# Patient Record
Sex: Male | Born: 1935 | Race: White | Hispanic: No | Smoking: Never smoker
Health system: Southern US, Community
[De-identification: ages and names within clinical notes are randomized; demographics above are authoritative.]

## PROBLEM LIST (undated history)

## (undated) DIAGNOSIS — M1990 Unspecified osteoarthritis, unspecified site: Secondary | ICD-10-CM

## (undated) DIAGNOSIS — E785 Hyperlipidemia, unspecified: Secondary | ICD-10-CM

## (undated) DIAGNOSIS — Z950 Presence of cardiac pacemaker: Secondary | ICD-10-CM

## (undated) DIAGNOSIS — R55 Syncope and collapse: Secondary | ICD-10-CM

## (undated) DIAGNOSIS — I495 Sick sinus syndrome: Secondary | ICD-10-CM

## (undated) DIAGNOSIS — R351 Nocturia: Secondary | ICD-10-CM

## (undated) DIAGNOSIS — D510 Vitamin B12 deficiency anemia due to intrinsic factor deficiency: Secondary | ICD-10-CM

## (undated) DIAGNOSIS — R2681 Unsteadiness on feet: Secondary | ICD-10-CM

## (undated) DIAGNOSIS — Z974 Presence of external hearing-aid: Secondary | ICD-10-CM

## (undated) DIAGNOSIS — M810 Age-related osteoporosis without current pathological fracture: Secondary | ICD-10-CM

## (undated) DIAGNOSIS — R251 Tremor, unspecified: Secondary | ICD-10-CM

## (undated) DIAGNOSIS — N400 Enlarged prostate without lower urinary tract symptoms: Secondary | ICD-10-CM

## (undated) DIAGNOSIS — H9190 Unspecified hearing loss, unspecified ear: Secondary | ICD-10-CM

## (undated) DIAGNOSIS — I10 Essential (primary) hypertension: Secondary | ICD-10-CM

## (undated) HISTORY — DX: Essential (primary) hypertension: I10

## (undated) HISTORY — DX: Vitamin B12 deficiency anemia due to intrinsic factor deficiency: D51.0

## (undated) HISTORY — DX: Benign prostatic hyperplasia without lower urinary tract symptoms: N40.0

## (undated) HISTORY — PX: TONSILLECTOMY AND ADENOIDECTOMY: SUR1326

## (undated) HISTORY — DX: Syncope and collapse: R55

## (undated) HISTORY — DX: Age-related osteoporosis without current pathological fracture: M81.0

## (undated) HISTORY — DX: Tremor, unspecified: R25.1

## (undated) HISTORY — DX: Hyperlipidemia, unspecified: E78.5

## (undated) HISTORY — DX: Nocturia: R35.1

## (undated) HISTORY — PX: APPENDECTOMY: SHX54

## (undated) HISTORY — DX: Sick sinus syndrome: I49.5

## (undated) HISTORY — DX: Unsteadiness on feet: R26.81

## (undated) HISTORY — PX: CHOLECYSTECTOMY: SHX55

## (undated) HISTORY — DX: Presence of external hearing-aid: Z97.4

## (undated) HISTORY — DX: Unspecified osteoarthritis, unspecified site: M19.90

---

## 1998-01-22 ENCOUNTER — Emergency Department (HOSPITAL_COMMUNITY): Admission: EM | Admit: 1998-01-22 | Discharge: 1998-01-22 | Payer: Self-pay | Admitting: Emergency Medicine

## 1998-03-18 ENCOUNTER — Emergency Department (HOSPITAL_COMMUNITY): Admission: EM | Admit: 1998-03-18 | Discharge: 1998-03-18 | Payer: Self-pay | Admitting: Emergency Medicine

## 1998-03-30 ENCOUNTER — Ambulatory Visit (HOSPITAL_COMMUNITY): Admission: RE | Admit: 1998-03-30 | Discharge: 1998-03-30 | Payer: Self-pay | Admitting: *Deleted

## 2002-07-24 ENCOUNTER — Encounter: Admission: RE | Admit: 2002-07-24 | Discharge: 2002-07-24 | Payer: Self-pay | Admitting: Internal Medicine

## 2002-07-24 ENCOUNTER — Encounter: Payer: Self-pay | Admitting: Internal Medicine

## 2002-08-16 ENCOUNTER — Encounter: Admission: RE | Admit: 2002-08-16 | Discharge: 2002-08-16 | Payer: Self-pay | Admitting: Internal Medicine

## 2002-08-16 ENCOUNTER — Encounter: Payer: Self-pay | Admitting: Internal Medicine

## 2003-12-15 ENCOUNTER — Ambulatory Visit (HOSPITAL_COMMUNITY): Admission: RE | Admit: 2003-12-15 | Discharge: 2003-12-15 | Payer: Self-pay | Admitting: Cardiology

## 2004-12-09 ENCOUNTER — Emergency Department (HOSPITAL_COMMUNITY): Admission: EM | Admit: 2004-12-09 | Discharge: 2004-12-09 | Payer: Self-pay | Admitting: Emergency Medicine

## 2005-04-15 ENCOUNTER — Ambulatory Visit (HOSPITAL_COMMUNITY): Admission: RE | Admit: 2005-04-15 | Discharge: 2005-04-15 | Payer: Self-pay | Admitting: *Deleted

## 2005-04-15 ENCOUNTER — Encounter (INDEPENDENT_AMBULATORY_CARE_PROVIDER_SITE_OTHER): Payer: Self-pay | Admitting: Specialist

## 2009-08-09 ENCOUNTER — Emergency Department (HOSPITAL_COMMUNITY): Admission: EM | Admit: 2009-08-09 | Discharge: 2009-08-09 | Payer: Self-pay | Admitting: Emergency Medicine

## 2010-12-07 LAB — BASIC METABOLIC PANEL
BUN: 20 mg/dL (ref 6–23)
Calcium: 9.5 mg/dL (ref 8.4–10.5)
Creatinine, Ser: 1.25 mg/dL (ref 0.4–1.5)
GFR calc non Af Amer: 55 mL/min — ABNORMAL LOW (ref >60)

## 2010-12-07 LAB — CBC
HCT: 39.6 % (ref 39.0–52.0)
MCV: 101.8 fL — ABNORMAL HIGH (ref 78.0–100.0)
RDW: 14.3 % (ref 11.5–15.5)
WBC: 7.1 10*3/uL (ref 4.0–10.5)

## 2010-12-07 LAB — DIFFERENTIAL
Basophils Absolute: 0 10*3/uL (ref 0.0–0.1)
Eosinophils Absolute: 0.2 10*3/uL (ref 0.0–0.7)
Eosinophils Relative: 3 % (ref 0–5)
Lymphocytes Relative: 24 % (ref 12–46)
Lymphs Abs: 1.7 10*3/uL (ref 0.7–4.0)

## 2010-12-07 LAB — MAGNESIUM: Magnesium: 2.4 mg/dL (ref 1.5–2.5)

## 2011-01-21 NOTE — Op Note (Signed)
NAMEJAEQUAN, PROPES NO.:  [id number]   MEDICAL RECORD NO.:  [id number]          PATIENT TYPE:  AMB   LOCATION:  ENDO                         FACILITY:  [facility]   PHYSICIAN:  Georgiana Spinner, M.D.    DATE OF BIRTH:  08/13/1925   DATE OF PROCEDURE:  04/15/2005  DATE OF DISCHARGE:                                 OPERATIVE REPORT   PROCEDURE:  Colonoscopy with biopsy.   INDICATIONS:  Diarrhea.   ANESTHESIA:  Demerol 20, Versed 4 mg.   PROCEDURE:  With the patient mildly sedated in the left lateral decubitus  position, the Olympus videoscopic colonoscope was inserted in the rectum and  passed under direct vision to the cecum, identified by ileocecal valve and  appendiceal orifice, both of which were photographed.  From this point, the  colonoscope was slowly withdrawn, taking circumferential views of colonic  mucosa, stopping only on the way to photograph diverticula seen in the  sigmoid colon and to take random colonic biopsies from normal mucosa to rule  out microscopic colitis until we reached the rectum which appeared normal on  direct and showed hemorrhoids on retroflexed view.  The endoscope was  straightened, withdrawn.  The patient's vital signs and pulse oximeter  remained stable.  The patient tolerated the procedure well without apparent  complications.   FINDINGS:  Diverticulosis of sigmoid colon.  Internal hemorrhoids.  Otherwise, an unremarkable colonoscopic examination to the cecum.       GMO/MEDQ  D:  04/15/2005  T:  04/15/2005  Job:  [id number]

## 2012-05-09 ENCOUNTER — Ambulatory Visit
Admission: RE | Admit: 2012-05-09 | Discharge: 2012-05-09 | Disposition: A | Discharge status: Home / Self Care | Payer: Medicare Other | Source: Ambulatory Visit | Attending: Internal Medicine | Admitting: Internal Medicine

## 2012-05-09 ENCOUNTER — Other Ambulatory Visit: Payer: Self-pay | Admitting: Internal Medicine

## 2012-05-09 DIAGNOSIS — R410 Disorientation, unspecified: Secondary | ICD-10-CM

## 2013-11-14 ENCOUNTER — Encounter: Payer: Self-pay | Admitting: Cardiology

## 2013-11-25 ENCOUNTER — Telehealth: Payer: Self-pay | Admitting: Internal Medicine

## 2013-11-25 ENCOUNTER — Encounter: Payer: Self-pay | Admitting: Internal Medicine

## 2013-11-25 ENCOUNTER — Ambulatory Visit (INDEPENDENT_AMBULATORY_CARE_PROVIDER_SITE_OTHER): Payer: Medicare Other | Admitting: Internal Medicine

## 2013-11-25 ENCOUNTER — Encounter: Payer: Self-pay | Admitting: *Deleted

## 2013-11-25 VITALS — BP 128/82 | HR 102 | Ht 70.0 in | Wt 153.2 lb

## 2013-11-25 DIAGNOSIS — I495 Sick sinus syndrome: Secondary | ICD-10-CM

## 2013-11-25 DIAGNOSIS — R55 Syncope and collapse: Secondary | ICD-10-CM

## 2013-11-25 DIAGNOSIS — I10 Essential (primary) hypertension: Secondary | ICD-10-CM

## 2013-11-25 LAB — CBC WITH DIFFERENTIAL/PLATELET
BASOS ABS: 0 10*3/uL (ref 0.0–0.1)
Basophils Relative: 0.5 % (ref 0.0–3.0)
Eosinophils Absolute: 0.2 10*3/uL (ref 0.0–0.7)
Eosinophils Relative: 2.8 % (ref 0.0–5.0)
HCT: 37.5 % — ABNORMAL LOW (ref 39.0–52.0)
Hemoglobin: 12.5 g/dL — ABNORMAL LOW (ref 13.0–17.0)
LYMPHS ABS: 1.4 10*3/uL (ref 0.7–4.0)
LYMPHS PCT: 18.6 % (ref 12.0–46.0)
MCHC: 33.3 g/dL (ref 30.0–36.0)
MCV: 104.1 fl — ABNORMAL HIGH (ref 78.0–100.0)
MONO ABS: 0.6 10*3/uL (ref 0.1–1.0)
Monocytes Relative: 8.5 % (ref 3.0–12.0)
NEUTROS ABS: 5.3 10*3/uL (ref 1.4–7.7)
Neutrophils Relative %: 69.6 % (ref 43.0–77.0)
PLATELETS: 296 10*3/uL (ref 150.0–400.0)
RBC: 3.61 Mil/uL — ABNORMAL LOW (ref 4.22–5.81)
RDW: 15.2 % — AB (ref 11.5–14.6)
WBC: 7.6 10*3/uL (ref 4.5–10.5)

## 2013-11-25 LAB — BASIC METABOLIC PANEL
BUN: 20 mg/dL (ref 6–23)
CO2: 25 meq/L (ref 19–32)
Calcium: 9.5 mg/dL (ref 8.4–10.5)
Chloride: 101 mEq/L (ref 96–112)
Creatinine, Ser: 1.2 mg/dL (ref 0.4–1.5)
GFR: 62.07 mL/min (ref >60.00)
Glucose, Bld: 111 mg/dL — ABNORMAL HIGH (ref 70–99)
POTASSIUM: 3.9 meq/L (ref 3.5–5.1)
SODIUM: 134 meq/L — AB (ref 135–145)

## 2013-11-25 NOTE — Telephone Encounter (Signed)
New message     Pt saw Dr Johney FrameAllred today.  He says his heart is beating fast.  Should he take an aspirin?

## 2013-11-25 NOTE — Progress Notes (Signed)
Patient ID: Julian Taylor, male   DOB: [birth year], 80 y.o.   MRN: [id number]    Julian Muffunn, Kinta    Date of visit:  11/14/2013 DOB:  [birth year]    Age:  [age] yrs. Medical record number:  [id number]     Account number:  [id number] Primary Care Provider: Georgianne FickAMACHANDRAN, AJITH ____________________________ CURRENT DIAGNOSES  1. Syncope  2. Hypertension,Essential (Benign)  3. Hyperlipidemia  4. Pernicious Anemia ____________________________ ALLERGIES  No Known Allergies ____________________________ MEDICATIONS  1. folic acid 400 mcg tablet, 1 p.o. daily  2. cyanocobalamin (vit B-12) 1,000 mcg/mL injection solution, q month  3. aspirin 81 mg chewable tablet, 1 p.o. daily  4. atorvastatin 20 mg tablet, 1 p.o. daily  5. tramadol 50 mg tablet, PRN  6. tamsulosin ER 0.4 mg capsule,extended release 24 hr, 1 p.o. daily  7. amlodipine 5 mg tablet, 1 p.o. daily ____________________________ CHIEF COMPLAINTS  Syncope  better when he stopped losartan ____________________________ HISTORY OF PRESENT ILLNESS This very nice 80 year old male is seen for evaluation of syncope. The patient has a previous history of hypertension and also has significant BPH with frequent nocturia. He had a history of syncope several years ago according to the son at which point in time he was placed on beta blockers and after they were withdrawn had no recurrent problems. He again had some recurrent issues the year or so ago and again beta blockers were stopped. He recently had losartan added to his regimen as well as tamsulosin and had another episode of syncope after he had taking a shower and got out of the bathtub. He fell twisting his foot and had some bruise on it and was felt to have a slight fracture of the foot. This happened back in February but he has not had any recurrent episodes since then. He denies angina and has no PND, orthopnea or edema. He has long-standing tremor that has been present for years. He has an unsteady gait and  walks with a cane. He does not have angina and is able to live alone and manage most of his affairs. He is still driving. He has complained of muscles aching and generalized pain and his son thinks it has improved somewhat since his statins were stopped. He also is no longer taking losartan and has had no more syncope. ____________________________ PAST HISTORY  Past Medical Illnesses:  hypertension, hyperlipidemia, anemia, syncope, essential tremor, BPH;  Cardiovascular Illnesses:  no previous history of cardiac disease;  Surgical Procedures:  appendectomy, tonsillectomy/adenoids, cholecystectomy;  Cardiology Procedures-Invasive:  no previous interventional or invasive cardiology procedures;  Cardiology Procedures-Noninvasive:  no previous non-invasive cardiovascular testing;  LVEF not documented,   ____________________________ CARDIO-PULMONARY TEST DATES EKG Date:  11/14/2013;   ____________________________ FAMILY HISTORY Father -- Father dead, CVA Mother -- Mother dead Sister -- Sister alive and well Sister -- Sister alive and well ____________________________ SOCIAL HISTORY Alcohol Use:  no alcohol use;  Smoking:  used to smoke but quit;  Diet:  regular diet;  Lifestyle:  widower;  Exercise:  no regular exercise;  Occupation:  security at [employer];  Residence:  lives alone;   ____________________________ REVIEW OF SYSTEMS General:  denies recent weight change, fatique or change in exercise tolerance.  Integumentary:no rashes or new skin lesions. Eyes: wears eye glasses/contact lenses, cataract extraction Ears, Nose, Throat, Mouth:  partial hearing loss Respiratory: denies dyspnea, cough, wheezing or hemoptysis. Cardiovascular:  please review HPI Abdominal: constipation Genitourinary-Male: hesitancy, nocturia, frequency  Musculoskeletal:  generalized pain Neurological:  see HPI  Psychiatric:  denies depession or anxiety  ____________________________ PHYSICAL EXAMINATION VITAL SIGNS  Blood  Pressure:  124/70 Sitting, Left arm, regular cuff  , 120/72 Standing, Left arm and regular cuff   Pulse:  88/min. Weight:  154.00 lbs. Height:  71"BMI: 21  Constitutional:  pleasant white male in no acute distress walks with cane Skin:  warm and dry to touch, no apparent skin lesions, or masses noted. Head:  normocephalic, normal hair pattern, no masses or tenderness Eyes:  EOMS Intact, PERRLA, C and S clear, Funduscopic exam not done. ENT:  bilateral hearing aides present Neck:  supple, without massess. No JVD, thyromegaly or carotid bruits. Carotid upstroke normal. Chest:  normal symmetry, clear to auscultation. Cardiac:  regular rhythm, normal S1 and S2, No S3 or S4, no murmurs, gallops or rubs detected. Abdomen:  abdomen soft,non-tender, no masses, no hepatospenomegaly, or aneurysm noted Peripheral Pulses:  the femoral,dorsalis pedis, and posterior tibial pulses are full and equal bilaterally with no bruits auscultated. Extremities & Back:  no deformities, clubbing, cyanosis, erythema or edema observed. Normal muscle strength and tone. Neurological:  resting tremor, unsteady gait, wide based gait ____________________________ IMPRESSIONS/PLAN  1. Episode of syncope that may have been orthostatic in nature and may have been related to the addition of blood pressure medications 2. Significant BPH with nocturia and frequency 3. Hypertension 4. Tremor that is likely essential tremor 5. Hyperlipidemia  Recommendations:  He has not had recurrent syncope and his initial blood pressure was on the lower side when he came here when I rechecked it it was 150/70 after he ambulated down the hall. At this point he does have recurrent syncope that may have been medication or blood pressure related. At this point I think he needs the following workup and recommendations:  A. Obtain echocardiogram to evaluate her cardiac function and structural heart disease B. Wear a cardiac event monitor to be sure  he is not having bradyarrhythmias as a cause for his syncope C. Statins appear to be causing some muscle aches and I think at his age of 80 it would not be unreasonable just to stop them D. I would recommend obtaining a blood pressure monitor and monitoring blood pressure at home particularly standing blood pressure. A target blood pressure for his age would be around 150 systolic to 160 systolic. I think it would be important to avoid medicines that would lower his blood pressure particularly standing blood pressure. I would stay off of losartan.  His EKG shows left axis deviation and normal sinus rhythm. ____________________________ TODAYS ORDERS  1. 2D, color flow, doppler: First Available  2. [place]: 1 Day  3. Return Visit: 1 month  4. 12 Lead EKG: Today                       ____________________________ Cardiology Physician:  Darden Palmer MD [facility]

## 2013-11-25 NOTE — Telephone Encounter (Signed)
Discussed with Dr Johney FrameAllred and he said to let him know its okay.  Try and relax and it should come back down.  If not he can call us back but he is scheduled for PPM Implant on Thurs with echo prior on Wed

## 2013-11-25 NOTE — Progress Notes (Signed)
Primary Care Physician:   Dr Nicholos Johns Referring Physician:  Dr Rockne Coons Julian Taylor is a 80 y.o. male with a h/o hypertension, advanced age and recurrent syncope who presents for EP consultation.  He has had multiple episodes of syncope over the past few years.  Initially his syncope was attributed to beta blockers.  His syncope initially resolved with cessation of beta blockers.  Off of beta blockers he has done well.  3/14, he had an episode of syncope while off of beta blocker therapy.  He reports that he became very weak while shoveling snow.  He went inside and sat down on the commode to "rest".  He passed out and fell into the floor.  He feels clear that he was not straining or having a vagal event at the time.  He did very well until 2/15 when he had another episode of syncope.  With this event, he left the shower and was walking into his bedroom.  He collapsed with brief LOC and fell into the floor.  He was evaluated by Dr Donnie Aho and had an event monitor placed.  This has documented multiple sinus pauses of greated than 3 seconds.  He reports symptoms of palpitations and presyncope which correspond to his pauses.  He reports that this is a similar sensation to the one he has had prior to previous syncope.  Today, he denies symptoms of  chest pain, shortness of breath, orthopnea, PND, lower extremity edema, or neurologic sequela.  He is primarily limited by DJD in his knees.   The patient is tolerating medications without difficulties and is otherwise without complaint today.   Past Medical History  Diagnosis Date  . Essential hypertension, benign   . Pernicious anemia   . Hyperlipidemia   . BPH (benign prostatic hyperplasia)     With frequent nocturia  . Nocturia     Frequent  . Syncope     First happened several years ago and was better when Losartan was stopped. Started taking Losartan and Flomax and had another syncopal episode in Feb 2015.  . Tremor     Long-standing  .  Unsteady gait     Uses cane  . Wears hearing aid     Both ears  . Sick sinus syndrome   . Osteoarthritis   . Osteoporosis    Past Surgical History  Procedure Laterality Date  . Appendectomy    . Tonsillectomy and adenoidectomy    . Cholecystectomy      Current Outpatient Prescriptions  Medication Sig Dispense Refill  . acetaminophen (TYLENOL) 500 MG tablet Take 500 mg by mouth every 6 (six) hours as needed.      Marland Kitchen amLODipine (NORVASC) 5 MG tablet Take 5 mg by mouth daily.      Marland Kitchen aspirin 81 MG chewable tablet Chew 81 mg by mouth daily.      . Calcium Carb-Cholecalciferol (CALCIUM-VITAMIN D) 600-400 MG-UNIT TABS Take 1 tablet by mouth daily.      . CYANOCOBALAMIN IJ (Vitamin B12 1000 mcg/mL)  Inject once a month      . folic acid (FOLVITE) 400 MCG tablet Take 400 mcg by mouth daily.      Marland Kitchen ibuprofen (ADVIL,MOTRIN) 200 MG tablet Take 200 mg by mouth every 6 (six) hours as needed.      . traMADol (ULTRAM) 50 MG tablet Take 50 mg by mouth as needed.       No current facility-administered medications for this visit.    No  Known Allergies  History   Social History  . Marital Status: Widowed    Spouse Name: N/A    Number of Children: N/A  . Years of Education: N/A   Occupational History  . Not on file.   Social History Main Topics  . Smoking status: Former Games developermoker  . Smokeless tobacco: Not on file  . Alcohol Use: No  . Drug Use: No  . Sexual Activity: Not on file   Other Topics Concern  . Not on file   Social History Narrative   [place] in [place].  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 CVA Father     ROS- All systems are reviewed and negative except as per the HPI above  Physical Exam: Filed Vitals:   11/25/13 1028  BP: 128/82  Pulse: 102  Height: 5\' 10"  (1.778 m)  Weight: 153 lb 3.2 oz (69.491 kg)    GEN- The patient is well appearing, alert and oriented x 3 today.   Head- normocephalic,  atraumatic Eyes-  Sclera clear, conjunctiva pink Ears- hearing intact Oropharynx- clear Neck- supple, no JVP Lymph- no cervical lymphadenopathy Lungs- Clear to ausculation bilaterally, normal work of breathing Heart- Regular rate and rhythm, no murmurs, rubs or gallops, PMI not laterally displaced GI- soft, NT, ND, + BS Extremities- no clubbing, cyanosis, or edema MS- no significant deformity or atrophy Skin- no rash or lesion Psych- euthymic mood, full affect Neuro- strength and sensation are intact  EKG 11/14/13 reveals sinus rhythm 79 bpm, PR 153, LAHB Event monitor is reviewed and discussed with Dr Donnie Ahoilley by phone today.  This reveals multiple pauses up to 3.8 seconds in duration Dr Tawana Scaleilleys notes are reviewed.  I have also spoken with Dr Donnie Ahoilley by phone today  Assessment and Plan:   1. Sick sinus syndrome The patient has symptomatic bradycardia with pauses up to 3.8 seconds documented. No reversible causes are found.   I would therefore recommend pacemaker implantation at this time.  Risks, benefits, alternatives to pacemaker implantation were discussed in detail with the patient today. The patient understands that the risks include but are not limited to bleeding, infection, pneumothorax, perforation, tamponade, vascular damage, renal failure, MI, stroke, death,  and lead dislodgement and wishes to proceed. We will therefore schedule the procedure at the next available time.  We will obtain an echo prior to the procedure to evaluate for any structural heart disease.  2. HTN Stable No change required today  3. Syncope I share Dr Tawana Scaleilleys concern that this is due to sick sinus syndrome and prolonged pauses.  Echo is pending. No driving x 6 months (pt is aware)

## 2013-11-25 NOTE — Patient Instructions (Signed)
Your physician has recommended that you have a pacemaker inserted. A pacemaker is a small device that is placed under the skin of your chest or abdomen to help control abnormal heart rhythms. This device uses electrical pulses to prompt the heart to beat at a normal rate. Pacemakers are used to treat heart rhythms that are too slow. Wire (leads) are attached to the pacemaker that goes into the chambers of you heart. This is done in the hospital and usually requires and overnight stay. Please see the instruction sheet given to you today for more information.  Your physician has requested that you have an echocardiogram. Echocardiography is a painless test that uses sound waves to create images of your heart. It provides your doctor with information about the size and shape of your heart and how well your heart's chambers and valves are working. This procedure takes approximately one hour. There are no restrictions for this procedure.---on 3/25 at 3:15   See instruction sheet for Pacemaker Implant

## 2013-11-26 DIAGNOSIS — I495 Sick sinus syndrome: Secondary | ICD-10-CM | POA: Insufficient documentation

## 2013-11-26 DIAGNOSIS — R55 Syncope and collapse: Secondary | ICD-10-CM | POA: Insufficient documentation

## 2013-11-26 DIAGNOSIS — I10 Essential (primary) hypertension: Secondary | ICD-10-CM | POA: Insufficient documentation

## 2013-11-27 ENCOUNTER — Ambulatory Visit (HOSPITAL_BASED_OUTPATIENT_CLINIC_OR_DEPARTMENT_OTHER): Payer: Medicare Other | Admitting: *Deleted

## 2013-11-27 DIAGNOSIS — R55 Syncope and collapse: Secondary | ICD-10-CM

## 2013-11-27 MED ORDER — CHLORHEXIDINE GLUCONATE 4 % EX LIQD
60.0000 mL | Freq: Once | CUTANEOUS | Status: DC
  Filled 2013-11-27: qty 60

## 2013-11-27 MED ORDER — SODIUM CHLORIDE 0.9 % IV SOLN
INTRAVENOUS | Status: DC
  Administered 2013-11-28: 50 mL/h via INTRAVENOUS

## 2013-11-27 MED ORDER — CEFAZOLIN SODIUM-DEXTROSE 2-3 GM-% IV SOLR
2.0000 g | INTRAVENOUS | Status: DC
  Filled 2013-11-27: qty 50

## 2013-11-27 MED ORDER — SODIUM CHLORIDE 0.9 % IR SOLN
80.0000 mg | Status: DC
  Filled 2013-11-27 (×2): qty 2

## 2013-11-27 NOTE — Progress Notes (Signed)
Echo Complete

## 2013-11-28 ENCOUNTER — Encounter (HOSPITAL_COMMUNITY): Payer: Self-pay | Admitting: *Deleted

## 2013-11-28 ENCOUNTER — Ambulatory Visit (HOSPITAL_COMMUNITY)
Admission: RE | Admit: 2013-11-28 | Discharge: 2013-11-29 | Disposition: A | Discharge status: Home / Self Care | Payer: Medicare Other | Source: Ambulatory Visit | Attending: Internal Medicine | Admitting: Internal Medicine

## 2013-11-28 ENCOUNTER — Encounter (HOSPITAL_COMMUNITY)
Admission: RE | Disposition: A | Discharge status: Home / Self Care | Payer: Self-pay | Source: Ambulatory Visit | Attending: Internal Medicine

## 2013-11-28 DIAGNOSIS — E785 Hyperlipidemia, unspecified: Secondary | ICD-10-CM | POA: Insufficient documentation

## 2013-11-28 DIAGNOSIS — Z950 Presence of cardiac pacemaker: Secondary | ICD-10-CM

## 2013-11-28 DIAGNOSIS — I495 Sick sinus syndrome: Secondary | ICD-10-CM

## 2013-11-28 DIAGNOSIS — M810 Age-related osteoporosis without current pathological fracture: Secondary | ICD-10-CM | POA: Insufficient documentation

## 2013-11-28 DIAGNOSIS — Z7982 Long term (current) use of aspirin: Secondary | ICD-10-CM | POA: Insufficient documentation

## 2013-11-28 DIAGNOSIS — N400 Enlarged prostate without lower urinary tract symptoms: Secondary | ICD-10-CM | POA: Insufficient documentation

## 2013-11-28 DIAGNOSIS — M1990 Unspecified osteoarthritis, unspecified site: Secondary | ICD-10-CM | POA: Insufficient documentation

## 2013-11-28 DIAGNOSIS — D510 Vitamin B12 deficiency anemia due to intrinsic factor deficiency: Secondary | ICD-10-CM | POA: Insufficient documentation

## 2013-11-28 DIAGNOSIS — I10 Essential (primary) hypertension: Secondary | ICD-10-CM | POA: Insufficient documentation

## 2013-11-28 DIAGNOSIS — Z87891 Personal history of nicotine dependence: Secondary | ICD-10-CM | POA: Insufficient documentation

## 2013-11-28 DIAGNOSIS — R259 Unspecified abnormal involuntary movements: Secondary | ICD-10-CM | POA: Insufficient documentation

## 2013-11-28 DIAGNOSIS — R55 Syncope and collapse: Secondary | ICD-10-CM | POA: Insufficient documentation

## 2013-11-28 HISTORY — DX: Presence of cardiac pacemaker: Z95.0

## 2013-11-28 HISTORY — PX: INSERT / REPLACE / REMOVE PACEMAKER: SUR710

## 2013-11-28 HISTORY — PX: PERMANENT PACEMAKER INSERTION: SHX5480

## 2013-11-28 HISTORY — PX: PACEMAKER INSERTION: SHX728

## 2013-11-28 LAB — GLUCOSE, CAPILLARY: Glucose-Capillary: 161 mg/dL — ABNORMAL HIGH (ref 70–99)

## 2013-11-28 LAB — SURGICAL PCR SCREEN
MRSA, PCR: NEGATIVE
STAPHYLOCOCCUS AUREUS: NEGATIVE

## 2013-11-28 SURGERY — PERMANENT PACEMAKER INSERTION
Anesthesia: LOCAL

## 2013-11-28 MED ORDER — HYDROCODONE-ACETAMINOPHEN 5-325 MG PO TABS: 1.0000 | ORAL_TABLET | ORAL | Status: DC | PRN

## 2013-11-28 MED ORDER — SODIUM CHLORIDE 0.9 % IJ SOLN: 3.0000 mL | INTRAMUSCULAR | Status: DC | PRN

## 2013-11-28 MED ORDER — AMLODIPINE BESYLATE 5 MG PO TABS
5.0000 mg | ORAL_TABLET | Freq: Every day | ORAL | Status: DC
  Administered 2013-11-28: 5 mg via ORAL
  Filled 2013-11-28 (×2): qty 1

## 2013-11-28 MED ORDER — FENTANYL CITRATE 0.05 MG/ML IJ SOLN
INTRAMUSCULAR | Status: AC
  Filled 2013-11-28: qty 2

## 2013-11-28 MED ORDER — CEFAZOLIN SODIUM 1-5 GM-% IV SOLN
1.0000 g | Freq: Four times a day (QID) | INTRAVENOUS | Status: AC
  Administered 2013-11-28 – 2013-11-29 (×2): 1 g via INTRAVENOUS
  Filled 2013-11-28 (×2): qty 50

## 2013-11-28 MED ORDER — SODIUM CHLORIDE 0.9 % IV SOLN: 250.0000 mL | INTRAVENOUS | Status: DC | PRN

## 2013-11-28 MED ORDER — LIDOCAINE HCL (PF) 1 % IJ SOLN
INTRAMUSCULAR | Status: AC
  Filled 2013-11-28: qty 60

## 2013-11-28 MED ORDER — MIDAZOLAM HCL 5 MG/5ML IJ SOLN
INTRAMUSCULAR | Status: AC
  Filled 2013-11-28: qty 5

## 2013-11-28 MED ORDER — SODIUM CHLORIDE 0.9 % IR SOLN
80.0000 mg | Status: DC
  Filled 2013-11-28: qty 2

## 2013-11-28 MED ORDER — TAMSULOSIN HCL 0.4 MG PO CAPS
0.4000 mg | ORAL_CAPSULE | Freq: Every day | ORAL | Status: DC
  Administered 2013-11-28 – 2013-11-29 (×2): 0.4 mg via ORAL
  Filled 2013-11-28 (×2): qty 1

## 2013-11-28 MED ORDER — MUPIROCIN 2 % EX OINT
TOPICAL_OINTMENT | CUTANEOUS | Status: AC
  Administered 2013-11-28: 1
  Filled 2013-11-28: qty 22

## 2013-11-28 MED ORDER — ONDANSETRON HCL 4 MG/2ML IJ SOLN: 4.0000 mg | Freq: Four times a day (QID) | INTRAMUSCULAR | Status: DC | PRN

## 2013-11-28 MED ORDER — TRAMADOL HCL 50 MG PO TABS
50.0000 mg | ORAL_TABLET | Freq: Three times a day (TID) | ORAL | Status: DC | PRN
  Administered 2013-11-29: 50 mg via ORAL
  Filled 2013-11-28: qty 1

## 2013-11-28 MED ORDER — CEFAZOLIN SODIUM 1-5 GM-% IV SOLN
1.0000 g | Freq: Four times a day (QID) | INTRAVENOUS | Status: DC
  Filled 2013-11-28 (×3): qty 50

## 2013-11-28 MED ORDER — SODIUM CHLORIDE 0.9 % IJ SOLN
3.0000 mL | Freq: Two times a day (BID) | INTRAMUSCULAR | Status: DC
  Administered 2013-11-28: 3 mL via INTRAVENOUS

## 2013-11-28 MED ORDER — ACETAMINOPHEN 325 MG PO TABS: 325.0000 mg | ORAL_TABLET | ORAL | Status: DC | PRN

## 2013-11-28 NOTE — Progress Notes (Signed)
Pt's sons concerned father will have difficulty with daily activities due to limited mobility of left arm post pacemaker, pt lives alone. PT/OT ordered. Emelda Brothershristy Yunique Dearcos RN

## 2013-11-28 NOTE — H&P (View-Only) (Signed)
Primary Care Physician:   Dr Julian Taylor Referring Physician:  Dr Julian Taylor Julian Taylor is a 80 y.o. male with a h/o hypertension, advanced age and recurrent syncope who presents for EP consultation.  He has had multiple episodes of syncope over the past few years.  Initially his syncope was attributed to beta blockers.  His syncope initially resolved with cessation of beta blockers.  Off of beta blockers he has done well.  3/14, he had an episode of syncope while off of beta blocker therapy.  He reports that he became very weak while shoveling snow.  He went inside and sat down on the commode to "rest".  He passed out and fell into the floor.  He feels clear that he was not straining or having a vagal event at the time.  He did very well until 2/15 when he had another episode of syncope.  With this event, he left the shower and was walking into his bedroom.  He collapsed with brief LOC and fell into the floor.  He was evaluated by Dr Julian Aho and had an event monitor placed.  This has documented multiple sinus pauses of greated than 3 seconds.  He reports symptoms of palpitations and presyncope which correspond to his pauses.  He reports that this is a similar sensation to the one he has had prior to previous syncope.  Today, he denies symptoms of  chest pain, shortness of breath, orthopnea, PND, lower extremity edema, or neurologic sequela.  He is primarily limited by DJD in his knees.   The patient is tolerating medications without difficulties and is otherwise without complaint today.   Past Medical History  Diagnosis Date  . Essential hypertension, benign   . Pernicious anemia   . Hyperlipidemia   . BPH (benign prostatic hyperplasia)     With frequent nocturia  . Nocturia     Frequent  . Syncope     First happened several years ago and was better when Losartan was stopped. Started taking Losartan and Flomax and had another syncopal episode in Feb 2015.  . Tremor     Long-standing  .  Unsteady gait     Uses cane  . Wears hearing aid     Both ears  . Sick sinus syndrome   . Osteoarthritis   . Osteoporosis    Past Surgical History  Procedure Laterality Date  . Appendectomy    . Tonsillectomy and adenoidectomy    . Cholecystectomy      Current Outpatient Prescriptions  Medication Sig Dispense Refill  . acetaminophen (TYLENOL) 500 MG tablet Take 500 mg by mouth every 6 (six) hours as needed.      Marland Kitchen amLODipine (NORVASC) 5 MG tablet Take 5 mg by mouth daily.      Marland Kitchen aspirin 81 MG chewable tablet Chew 81 mg by mouth daily.      . Calcium Carb-Cholecalciferol (CALCIUM-VITAMIN D) 600-400 MG-UNIT TABS Take 1 tablet by mouth daily.      . CYANOCOBALAMIN IJ (Vitamin B12 1000 mcg/mL)  Inject once a month      . folic acid (FOLVITE) 400 MCG tablet Take 400 mcg by mouth daily.      Marland Kitchen ibuprofen (ADVIL,MOTRIN) 200 MG tablet Take 200 mg by mouth every 6 (six) hours as needed.      . traMADol (ULTRAM) 50 MG tablet Take 50 mg by mouth as needed.       No current facility-administered medications for this visit.    No  Known Allergies  History   Social History  . Marital Status: Widowed    Spouse Name: N/A    Number of Children: N/A  . Years of Education: N/A   Occupational History  . Not on file.   Social History Main Topics  . Smoking status: Former Games developermoker  . Smokeless tobacco: Not on file  . Alcohol Use: No  . Drug Use: No  . Sexual Activity: Not on file   Other Topics Concern  . Not on file   Social History Narrative   Lives alone in [place].  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 CVA Father     ROS- All systems are reviewed and negative except as per the HPI above  Physical Exam: Filed Vitals:   11/25/13 1028  BP: 128/82  Pulse: 102  Height: 5\' 10"  (1.778 m)  Weight: 153 lb 3.2 oz (69.491 kg)    GEN- The patient is well appearing, alert and oriented x 3 today.   Head- normocephalic,  atraumatic Eyes-  Sclera clear, conjunctiva pink Ears- hearing intact Oropharynx- clear Neck- supple, no JVP Lymph- no cervical lymphadenopathy Lungs- Clear to ausculation bilaterally, normal work of breathing Heart- Regular rate and rhythm, no murmurs, rubs or gallops, PMI not laterally displaced GI- soft, NT, ND, + BS Extremities- no clubbing, cyanosis, or edema MS- no significant deformity or atrophy Skin- no rash or lesion Psych- euthymic mood, full affect Neuro- strength and sensation are intact  EKG 11/14/13 reveals sinus rhythm 79 bpm, PR 153, LAHB Event monitor is reviewed and discussed with Dr Julian Taylor by phone today.  This reveals multiple pauses up to 3.8 seconds in duration Dr Julian Taylor notes are reviewed.  I have also spoken with Dr Julian Taylor by phone today  Assessment and Plan:   1. Sick sinus syndrome The patient has symptomatic bradycardia with pauses up to 3.8 seconds documented. No reversible causes are found.   I would therefore recommend pacemaker implantation at this time.  Risks, benefits, alternatives to pacemaker implantation were discussed in detail with the patient today. The patient understands that the risks include but are not limited to bleeding, infection, pneumothorax, perforation, tamponade, vascular damage, renal failure, MI, stroke, death,  and lead dislodgement and wishes to proceed. We will therefore schedule the procedure at the next available time.  We will obtain an echo prior to the procedure to evaluate for any structural heart disease.  2. HTN Stable No change required today  3. Syncope I share Dr Julian Taylor concern that this is due to sick sinus syndrome and prolonged pauses.  Echo is pending. No driving x 6 months (pt is aware)

## 2013-11-28 NOTE — Discharge Instructions (Signed)
° °  Supplemental Discharge Instructions for  Pacemaker/Defibrillator Patients  Activity No heavy lifting or vigorous activity with your left/right arm for 6 to 8 weeks.  Do not raise your left/right arm above your head for one week.  Gradually raise your affected arm as drawn below.           03/30                      03/31                       04/01                      04/02       NO DRIVING for 6 months until given clearance by Dr. Johney FrameAllred. WOUND CARE   Keep the wound area clean and dry.  Do not get this area wet for one week. No showers for one week; you may shower on 12/06/2013.   The tape/steri-strips on your wound will fall off; do not pull them off.  No bandage is needed on the site.  DO  NOT apply any creams, oils, or ointments to the wound area.   If you notice any drainage or discharge from the wound, any swelling or bruising at the site, or you develop a fever > 101? F after you are discharged home, call the office at once.  Special Instructions   You are still able to use cellular telephones; use the ear opposite the side where you have your pacemaker/defibrillator.  Avoid carrying your cellular phone near your device.   When traveling through airports, show security personnel your identification card to avoid being screened in the metal detectors.  Ask the security personnel to use the hand wand.   Avoid arc welding equipment, MRI testing (magnetic resonance imaging), TENS units (transcutaneous nerve stimulators).  Call the office for questions about other devices.   Avoid electrical appliances that are in poor condition or are not properly grounded.   Microwave ovens are safe to be near or to operate.

## 2013-11-28 NOTE — Op Note (Signed)
SURGEON:  [name], MD     PREPROCEDURE DIAGNOSIS:  Symptomatic sinus bradycardia, sick sinus syndrome    POSTPROCEDURE DIAGNOSIS:  Symptomatic sinus bradycardia, sick sinus syndrome     PROCEDURES:   1. Left upper extremity venography.   2. Pacemaker implantation.     INTRODUCTION: [name] is a 80 y.o. male  with a history of symptomatic sinus bradycardia, sick sinus syndrome, and syncope who presents today for pacemaker implantation.  The patient reports intermittent episodes of presyncope recently.  He has had prior syncope.  Event monitor has documented multiple sinus pauses as the source for his symptoms.  No reversible causes have been identified.  The patient therefore presents today for pacemaker implantation.     DESCRIPTION OF PROCEDURE:  Informed written consent was obtained, and the patient was brought to the electrophysiology lab in a fasting state.  The patient received 1mg  IV Versed as sedation for the procedure today.  The patients left chest was prepped and draped in the usual sterile fashion by the EP lab staff. The skin overlying the left deltopectoral region was infiltrated with lidocaine for local analgesia.  A 4-cm incision was made over the left deltopectoral region.  A left subcutaneous pacemaker pocket was fashioned using a combination of sharp and blunt dissection.  There was very little subcutaneous tissue.  Electrocautery was required to assure hemostasis.    Left Upper Extremity Venography: A venogram of the left upper extremity was performed, which revealed a very small and atretic left cephalic vein, which emptied into a small left subclavian vein.  The left axillary vein was small in size.  The thorax was small and then pectoral tissues were very thin/ friable.  RA/RV Lead Placement: The left axillary vein was therefore cannulated.  Through the left cephalic vein, a Medtronic model [id number] (serial number [id number]) right atrial lead and a Medtronic model  5092- 58 (serial number [id number]) right ventricular lead were advanced with fluoroscopic visualization into the right atrial appendage and right ventricular apex positions respectively.  Initial atrial lead P- waves measured 4.7 mV with impedance of 470 ohms and a threshold of 0.5 V at 0.5 msec.  Right ventricular lead R-waves measured 9 mV with an impedance of 688 ohms and a threshold of 0.5 V at 0.5 msec.  Both leads   were secured to the pectoralis fascia using #2-0 silk over the suture sleeves.   Device Placement:  The leads were then connected to a Medtronic Adapta L model ADDRL 1 (serial number [id number]) pacemaker.  The pocket was irrigated with copious gentamicin solution.  The pacemaker was then placed into the pocket.  The pocket was then closed in 2 layers with 2.0 Vicryl suture for the subcutaneous and subcuticular layers.  Steri- Strips and a sterile dressing were then applied.  There were no early apparent complications.     CONCLUSIONS:   1. Successful implantation of a Medtronic Adapta L dual-chamber pacemaker for symptomatic sinus bradycardia, sick sinus syndrome, and syncope  2. No early apparent complications.           Hillis RangeJames Laylonie Marzec, MD 11/28/2013 4:47 PM

## 2013-11-28 NOTE — Interval H&P Note (Signed)
History and Physical Interval Note:  11/28/2013 3:32 PM  Julian Taylor  has presented today for surgery, with the diagnosis of Syncope/ Bradycardia with pauses  The various methods of treatment have been discussed with the patient and family. After consideration of risks, benefits and other options for treatment, the patient has consented to  Procedure(s): PERMANENT PACEMAKER INSERTION (N/A) as a surgical intervention .  The patient's history has been reviewed, patient examined, no change in status, stable for surgery.  I have reviewed the patient's chart and labs.  Questions were answered to the patient's satisfaction.     Hillis RangeJames Luwana Butrick

## 2013-11-28 NOTE — Discharge Summary (Signed)
ELECTROPHYSIOLOGY PROCEDURE DISCHARGE SUMMARY    Patient ID: Julian Taylor,  MRN: [id number], DOB/AGE: [birth year] [age] y.o.  Admit date: 11/28/2013 Discharge date: 11/29/2013  Primary Care Physician: Georgianne FickAMACHANDRAN,AJITH, MD Primary Cardiologist: Donnie Ahoilley Electrophysiologist: Obadiah Dennard  Primary Discharge Diagnosis:  Symptomatic bradycardia s/p pacemaker implantation this admission  Secondary Discharge Diagnosis:  1.  Syncope 2.  Hypertension 3.  Hyperlipidemia 4.  BPH 5.  Pernicious anemia 6.  Osteoporosis  No Known Allergies   Procedures This Admission:  1.  Implantation of a Medtronic dual chamber pacemaker on 11-28-13 by Dr Johney FrameAllred.  The patient received a Medtronic model number ADDRL1 with model number 5592 right atrial lead, 5092 right ventricular lead.  There were no immediate post procedure complications. 2.  CXR on 11-29-13 demonstrated no pneumothorax status post device implantation.   Brief HPI: Julian Taylor is a 80 y.o. male with a history of symptomatic sinus bradycardia, sick sinus syndrome, and syncope who presents today for pacemaker implantation. The patient reports intermittent episodes of presyncope recently. He has had prior syncope. Event monitor has documented multiple sinus pauses as the source for his symptoms. No reversible causes have been identified. Risks, benefits and alternatives to pacemaker implantation were reviewed with the patient who wished to proceed.    Hospital Course:  The patient was admitted and underwent implantation of a Medtronic dual chamber pacemaker with details as outlined above.   He was monitored on telemetry overnight which demonstrated sinus rhythm with PAC's, occ V pacing.  Left chest was without hematoma or ecchymosis.  The device was interrogated and found to be functioning normally.  CXR was obtained and demonstrated no pneumothorax status post device implantation.  Wound care, arm mobility, and restrictions were reviewed with the  patient.  Dr Johney FrameAllred examined the patient and considered them stable for discharge to home.  He was evaluated by PT and felt to benefit from rehab.  He is therefore discharged to SNF.   Discharge Vitals: Blood pressure 111/92, pulse 99, temperature 97.4 F (36.3 C), temperature source Oral, resp. rate 20, height 5\' 10"  (1.778 m), weight 152 lb (68.947 kg), SpO2 99.00%.   Labs:   Lab Results  Component Value Date   WBC 7.6 11/25/2013   HGB 12.5* 11/25/2013   HCT 37.5* 11/25/2013   MCV 104.1* 11/25/2013   PLT 296.0 11/25/2013     Recent Labs Lab 11/29/13 0431  NA 137  K 4.2  CL 99  CO2 21  BUN 20  CREATININE 1.14  CALCIUM 8.9  GLUCOSE 126*     Discharge Medications:    Medication List    STOP taking these medications       ibuprofen 200 MG tablet  Commonly known as:  ADVIL,MOTRIN      TAKE these medications       acetaminophen 500 MG tablet  Commonly known as:  TYLENOL  Take 1,000 mg by mouth 2 (two) times daily as needed (pain).     amLODipine 5 MG tablet  Commonly known as:  NORVASC  Take 5 mg by mouth at bedtime.     aspirin EC 81 MG tablet  Take 81 mg by mouth daily.     Calcium-Vitamin D 600-400 MG-UNIT Tabs  Take 1 tablet by mouth daily.     cyanocobalamin 1000 MCG/ML injection  Commonly known as:  (VITAMIN B-12)  Inject 1,000 mcg into the muscle every 30 (thirty) days. Last injection March 6,2015     folic acid 400 MCG  tablet  Commonly known as:  FOLVITE  Take 400 mcg by mouth daily.     tamsulosin 0.4 MG Caps capsule  Commonly known as:  FLOMAX  Take 0.4 mg by mouth daily.     traMADol 50 MG tablet  Commonly known as:  ULTRAM  Take 50 mg by mouth every 8 (eight) hours as needed (pain).        Disposition:   Future Appointments Provider Department Dept Phone   12/11/2013 11:30 AM [facility] Office [phone]     Follow-up Information   Follow up with [facility] On 12/11/2013. (At  11:30 AM for wound check)    Specialty:  Cardiology   Contact information:   [address] [place] [address] [phone]      Follow up with Hillis Range, MD In 3 months. (Our office will notify you of your appointment date and time)    Specialty:  Cardiology   Contact information:   [address] [place] [address] [phone]       Duration of Discharge Encounter: Greater than 30 minutes including physician time.  Signed,   Hillis Range MD

## 2013-11-29 ENCOUNTER — Encounter (HOSPITAL_COMMUNITY): Payer: Self-pay | Admitting: General Practice

## 2013-11-29 ENCOUNTER — Ambulatory Visit (HOSPITAL_COMMUNITY): Payer: Medicare Other

## 2013-11-29 ENCOUNTER — Other Ambulatory Visit: Payer: Self-pay

## 2013-11-29 ENCOUNTER — Encounter: Payer: Self-pay | Admitting: Cardiology

## 2013-11-29 DIAGNOSIS — R55 Syncope and collapse: Secondary | ICD-10-CM

## 2013-11-29 DIAGNOSIS — Z950 Presence of cardiac pacemaker: Secondary | ICD-10-CM | POA: Insufficient documentation

## 2013-11-29 LAB — BASIC METABOLIC PANEL
BUN: 20 mg/dL (ref 6–23)
CO2: 21 mEq/L (ref 19–32)
CREATININE: 1.14 mg/dL (ref 0.50–1.35)
Calcium: 8.9 mg/dL (ref 8.4–10.5)
Chloride: 99 mEq/L (ref 96–112)
GFR calc Af Amer: 65 mL/min — ABNORMAL LOW (ref >90)
GFR, EST NON AFRICAN AMERICAN: 56 mL/min — AB (ref >90)
Glucose, Bld: 126 mg/dL — ABNORMAL HIGH (ref 70–99)
Potassium: 4.2 mEq/L (ref 3.7–5.3)
Sodium: 137 mEq/L (ref 137–147)

## 2013-11-29 LAB — GLUCOSE, CAPILLARY
GLUCOSE-CAPILLARY: 140 mg/dL — AB (ref 70–99)
Glucose-Capillary: 124 mg/dL — ABNORMAL HIGH (ref 70–99)

## 2013-11-29 NOTE — Progress Notes (Signed)
Nutrition Brief Note  Patient identified on the Malnutrition Screening Tool (MST) Report. Pt reports usual body weight is around 155 lb. Family at bedside confirm this. Pt has been eating less 2/2 advanced age, makes easy-to-prepare foods at home. Discussed option of having Ensure/Boost around for patient if/when he returns home, to increase kcal intake.  Wt Readings from Last 15 Encounters:  11/28/13 152 lb (68.947 kg)  11/28/13 152 lb (68.947 kg)  11/25/13 153 lb 3.2 oz (69.491 kg)    Body mass index is 21.81 kg/(m^2). Patient meets criteria for normal weight based on current BMI.   Current diet order is Heart Healthy, patient is consuming approximately 100% of meals at this time. Labs and medications reviewed.   No nutrition interventions warranted at this time. If nutrition issues arise, please consult RD.   Jarold MottoSamantha Christella App MS, RD, LDN Inpatient Registered Dietitian Pager: [phone] After-hours pager: [phone]

## 2013-11-29 NOTE — Progress Notes (Addendum)
Clinical Social Work Department BRIEF PSYCHOSOCIAL ASSESSMENT 11/29/2013  Patient:  [place],Joncarlos G     Account Number:  [id number]     Admit date:  11/28/2013  Clinical Social Worker:  Harless NakayamaAMBELAL,Nahsir Venezia, LCSWA  Date/Time:  11/29/2013 02:30 PM  Referred by:  Physician  Date Referred:  11/29/2013 Referred for  SNF Placement   Other Referral:   Interview type:  Patient Other interview type:   Spoke with pt and pt son at bedside    PSYCHOSOCIAL DATA Living Status:  ALONE Admitted from facility:   Level of care:   Primary support name:  Earney NavyRobert Faas [phone] Primary support relationship to patient:  CHILD, ADULT Degree of support available:   Pt has supportive family    CURRENT CONCERNS Current Concerns  Post-Acute Placement   Other Concerns:    SOCIAL WORK ASSESSMENT / PLAN CSW made aware that PT is recommending SNF. CSW spoke with pt and pt son at bedside about options. Pt and pt son familiar with a few facilities and [facility] [address] [facility] [address] CSW also explained insurance authorization process to pt and pt son. CSW did inform pt and pt son that insurance may not approve ST rehab and in that case PT is highly recommending that pt dc home with 24 hr supervision/assistance. Pt son understanding of this.    [facility] for [facility] authorization.   Assessment/plan status:  Psychosocial Support/Ongoing Assessment of Needs Other assessment/ plan:   Information/referral to community resources:   SNF list    PATIENT'S/FAMILY'S RESPONSE TO PLAN OF CARE: Pt and pt son agreeable to SNF     Soila Printup, LCSWA [phone]

## 2013-11-29 NOTE — Evaluation (Signed)
Physical Therapy Evaluation Patient Details Name: Julian Taylor MRN: [id number] DOB: Feb 02, 1925 Today's Date: 11/29/2013   History of Present Illness  IRIE FIORELLO is a 80 y.o. male  with a history of symptomatic sinus bradycardia, sick sinus syndrome, and syncope who presents today for pacemaker implantation.  The patient reports intermittent episodes of presyncope recently.  He has had prior syncope.  Event monitor has documented multiple sinus pauses as the source for his symptoms.  No reversible causes have been identified.  Now s/p pacemaker implantation.  Clinical Impression  Patient is s/p pacemaker surgery resulting in functional limitations due to limited use of his LUE and the deficits listed below (see PT Problem List). Discussed discharge options (as pt lives alone) and he does not have anyone who can take care of him and he is currently a high fall risk. Patient will benefit from skilled PT to increase their independence and safety with mobility to allow discharge to the venue listed below.       Follow Up Recommendations SNF;Supervision for mobility/OOB    Equipment Recommendations  Rolling walker with 5" wheels    Recommendations for Other Services       Precautions / Restrictions Precautions Precautions: ICD/Pacemaker;Fall Required Braces or Orthoses: Other Brace/Splint (LUE sling x 24 hrs post pacemaker)      Mobility  Bed Mobility Overal bed mobility: Needs Assistance Bed Mobility: Rolling;Sidelying to Sit Rolling: Supervision Sidelying to sit: Min guard       General bed mobility comments: vc for technique to maintain pacemaker precautions; pt effortful to come to sitting with near need for assist  Transfers Overall transfer level: Needs assistance Equipment used: Straight cane;Rolling walker (2 wheeled) Transfers: Sit to/from Stand Sit to Stand: Min assist         General transfer comment: Pt requires assist for power up from bed and with use of  momementum/rocking forward.  Ambulation/Gait Ambulation/Gait assistance: Min assist Ambulation Distance (Feet): 225 Feet Assistive device: Rolling walker (2 wheeled);Straight cane;1 person hand held assist Gait Pattern/deviations: Step-through pattern;Decreased stride length;Trunk flexed   Gait velocity interpretation: Below normal speed for age/gender General Gait Details: Initially tried cane, however pt reaching to hold onto surfaces and then therapist's hand and was unsteady. Educated pt on use of RW, however he needed cues 90% of time to correctly use RW and nearly tripped over back leg of RW while turning.   Stairs Stairs: Yes Stairs assistance: Min assist Stair Management: One rail Right;Two rails;Step to pattern;Sideways;Forwards Number of Stairs: 5 General stair comments: Pt required incr effort to advance from step to step; he was not safe using only one rail and allowed to use 2 with cues to minimize use of LUE.  Wheelchair Mobility    Modified Rankin (Stroke Patients Only)       Balance Overall balance assessment: Needs assistance Sitting-balance support: No upper extremity supported;Feet supported Sitting balance-Leahy Scale: Good Sitting balance - Comments: able to donn socks with crossing legs; able to reach to floor to tie shoe   Standing balance support: Bilateral upper extremity supported Standing balance-Leahy Scale: Poor Standing balance comment: bil UE supported on RW                     Pertinent Vitals/Pain Reports chronic pain from arthritis     Home Living Family/patient expects to be discharged to:: Private residence Living Arrangements: Alone Available Help at Discharge: Family;Available PRN/intermittently Type of Home: Apartment Home Access: Stairs to enter  Entrance Stairs-Rails: Right;Left Entrance Stairs-Number of Steps: 4 Home Layout: One level Home Equipment: Cane - single point;Shower seat;Grab bars - tub/shower;Grab bars -  toilet      Prior Function Level of Independence: Needs assistance   Gait / Transfers Assistance Needed: modified independent with cane (Rt hand)  ADL's / Homemaking Assistance Needed: sons assist with running errands        Hand Dominance        Extremity/Trunk Assessment   Upper Extremity Assessment: Defer to OT evaluation       LUE Deficits / Details: Hand, wrist, elbow AROM WFL.   Did not fully test shoulder ROM due to pacemaker precautions.   Lower Extremity Assessment: Generalized weakness      Cervical / Trunk Assessment: Kyphotic  Communication   Communication: HOH (bil hearing aids)  Cognition Arousal/Alertness: Awake/alert Behavior During Therapy: WFL for tasks assessed/performed Overall Cognitive Status: Within Functional Limits for tasks assessed                      General Comments General comments (skin integrity, edema, etc.): Excellent safety awareness and recognizes he is currently unsafe to go home alone. States his son works days and he would be alone at his son's.    Exercises        Assessment/Plan    PT Assessment Patient needs continued PT services  PT Diagnosis Difficulty walking;Generalized weakness   PT Problem List Decreased strength;Decreased range of motion;Decreased activity tolerance;Decreased balance;Decreased mobility;Decreased knowledge of use of DME;Decreased knowledge of precautions  PT Treatment Interventions DME instruction;Gait training;Stair training;Functional mobility training;Therapeutic activities;Therapeutic exercise;Balance training;Patient/family education   PT Goals (Current goals can be found in the Care Plan section) Acute Rehab PT Goals Patient Stated Goal: to get stronger PT Goal Formulation: With patient Time For Goal Achievement: 12/06/13 Potential to Achieve Goals: Good    Frequency Min 2X/week   Barriers to discharge Decreased caregiver support      End of Session Equipment Utilized  During Treatment: Gait belt Activity Tolerance: Patient tolerated treatment well Patient left: in chair;with call bell/phone within reach    Functional Assessment Tool Used: clinical judgement Functional Limitation: Mobility: Walking and moving around Mobility: Walking and Moving Around Current Status ([id number](G8978): At least 20 percent but less than 40 percent impaired, limited or restricted Mobility: Walking and Moving Around Goal Status [phone](G8979): At least 1 percent but less than 20 percent impaired, limited or restricted    Time: 0902-0939 PT Time Calculation (min): 37 min   Charges:     PT Treatments $Gait Training: 8-22 mins   PT G Codes:   Functional Assessment Tool Used: clinical judgement Functional Limitation: Mobility: Walking and moving around    Rooney Gladwin 11/29/2013, 10:20 AM Pager [phone]

## 2013-11-29 NOTE — Progress Notes (Signed)
Orthopedic Tech Progress Note Patient Details:  [place] G Corti 31-Mar-1925 [id number]  Ortho Devices Type of Ortho Device: Arm sling   Cammer, Mickie BailJennifer Carol 11/29/2013, 7:53 AM

## 2013-11-29 NOTE — Progress Notes (Signed)
Clinical Social Work Department CLINICAL SOCIAL WORK PLACEMENT NOTE 11/29/2013  Patient:  Julian Taylor,Julian Taylor  Account Number:  [id number] Admit date:  11/28/2013  Clinical Social Worker:  Harless NakayamaPOONUM Cage Gupton, LCSWA  Date/time:  11/29/2013 02:45 PM  Clinical Social Work is seeking post-discharge placement for this patient at the following level of care:   SKILLED NURSING   (*CSW will update this form in Epic as items are completed)   11/29/2013  Patient/family provided with [facility] Department of Clinical Social Work's list of facilities offering this level of care within the geographic area requested by the patient (or if unable, by the patient's family).  11/29/2013  Patient/family informed of their freedom to choose among providers that offer the needed level of care, that participate in Medicare, Medicaid or managed care program needed by the patient, have an available bed and are willing to accept the patient.  11/29/2013  Patient/family informed of [facility]' ownership interest in [facility], as well as of the fact that they are under no obligation to receive care at this facility.  PASARR submitted to EDS on 11/29/2013 PASARR number received from [employer] on 11/29/2013  FL2 transmitted to all facilities in geographic area requested by pt/family on  11/29/2013 FL2 transmitted to all facilities within larger geographic area on   Patient informed that his/her managed care company has contracts with or will negotiate with  certain facilities, including the following:     Patient/family informed of bed offers received:  11/29/2013 [facility] Physician recommends and patient chooses bed at    [facility]  11/29/2013 Patient to be transferred to facility by FAMILY  The following physician request were entered in Epic:   Additional Comments:   Hagen Tidd,  LCSWA [phone]

## 2013-11-29 NOTE — Evaluation (Signed)
Occupational Therapy Evaluation Patient Details Name: Julian Taylor MRN: [id number] DOB: Aug 30, 1925 Today's Date: 11/29/2013    History of Present Illness Julian Taylor is a 80 y.o. male  with a history of symptomatic sinus bradycardia, sick sinus syndrome, and syncope who presents today for pacemaker implantation.  The patient reports intermittent episodes of presyncope recently.  He has had prior syncope.  Event monitor has documented multiple sinus pauses as the source for his symptoms.  No reversible causes have been identified.  Now s/p pacemaker implantation.   Clinical Impression   Pt admitted with above.  Pt requires min assist for functional mobility and for all standing components of ADLs.  Pt reports he is not moving as well as he typically does.  Recommending SNF for d/c planning to further progress rehab and maximize independence with ADLs prior to eventual return home.  If pt is unable to go to SNF, he will need 24/7 supervision/assist from family/friends at home.  Will continue to follow acutely in order to address below problem list.    Follow Up Recommendations  SNF;Supervision/Assistance - 24 hour    Equipment Recommendations  None recommended by OT    Recommendations for Other Services       Precautions / Restrictions Precautions Precautions: ICD/Pacemaker;Fall Required Braces or Orthoses: Other Brace/Splint (L UE sling)      Mobility Bed Mobility Overal bed mobility:  (sitting EOB upon OT arrival)                Transfers Overall transfer level: Needs assistance Equipment used: Straight cane;Rolling walker (2 wheeled) Transfers: Sit to/from Stand Sit to Stand: Min assist         General transfer comment: Pt requires assist for power up from bed and with use of momementum/rocking forward.    Balance Overall balance assessment: Needs assistance         Standing balance support: Bilateral upper extremity supported Standing balance-Leahy Scale:  Poor Standing balance comment: bil UE supported on RW                    ADL         Lower Body Bathing: Minimal assistance;Sit to/from stand Lower Body Dressing: Minimal assistance;Sit to/from stand Toilet Transfer: Minimal assistance;Ambulation;Cueing for safety     Functional mobility during ADLs: Minimal assistance;Rolling walker;Cueing for safety;Cane General ADL Comments: Pt requires min assist for all standing components of ADLs.  When using SPC in right hand, pt reaching out with left hand for furniture to provide support.  Pt with better balance using RW but still requires min assist for safe use of RW.     Vision                     Perception     Praxis      Pertinent Vitals/Pain See vitals     Hand Dominance     Extremity/Trunk Assessment Upper Extremity Assessment Upper Extremity Assessment: LUE deficits/detail LUE Deficits / Details: Hand, wrist, elbow AROM WFL.   Did not fully test shoulder ROM due to pacemaker precautions.           Communication Communication Communication: HOH   Cognition Arousal/Alertness: Awake/alert Behavior During Therapy: WFL for tasks assessed/performed Overall Cognitive Status: Within Functional Limits for tasks assessed                     General Comments       Exercises  Home Living Family/patient expects to be discharged to:: Private residence Living Arrangements: Alone Available Help at Discharge: Family;Available PRN/intermittently Type of Home: Apartment Home Access: Stairs to enter Entrance Stairs-Number of Steps: 4 Entrance Stairs-Rails: Right;Left Home Layout: One level     Bathroom Shower/Tub: Chief Strategy OfficerTub/shower unit   Bathroom Toilet: Handicapped height     Home Equipment: Cane - single point;Shower seat;Grab bars - tub/shower;Grab bars - toilet          Prior Functioning/Environment Level of Independence: Needs assistance  Gait / Transfers Assistance Needed: modified  independent with cane (Rt hand) ADL's / Homemaking Assistance Needed: sons assist with running errands        OT Diagnosis:     OT Problem List: Decreased strength;Decreased activity tolerance;Impaired balance (sitting and/or standing);Decreased knowledge of use of DME or AE;Decreased knowledge of precautions;Impaired UE functional use;Pain   OT Treatment/Interventions: Self-care/ADL training;DME and/or AE instruction;Therapeutic activities;Patient/family education;Balance training    OT Goals(Current goals can be found in the care plan section) Acute Rehab OT Goals Patient Stated Goal: to get stronger OT Goal Formulation: With patient Time For Goal Achievement: 12/06/13 Potential to Achieve Goals: Good  OT Frequency: Min 2X/week   Barriers to D/C: Decreased caregiver support  pt unsure is his sons can provide 24/7 assist.       End of Session: Equipment Utilized During Treatment: Rolling walker;Gait belt  Activity Tolerance: Patient tolerated treatment well Patient left: in chair;with call bell/phone within reach (in LUE sling)   Time: 0630-16010925-0940 OT Time Calculation (min): 15 min Charges:  OT General Charges $OT Visit: 1 Procedure OT Evaluation $Initial OT Evaluation Tier I: 1 Procedure OT Treatments $Self Care/Home Management : 8-22 mins G-Codes: OT G-codes **NOT FOR INPATIENT CLASS** Functional Assessment Tool Used: clinical judgement Functional Limitation: Self care Self Care Current Status (U9323(G8987): At least 20 percent but less than 40 percent impaired, limited or restricted Self Care Goal Status (F5732(G8988): At least 1 percent but less than 20 percent impaired, limited or restricted  Cipriano MileJohnson, Jenna Elizabeth 11/29/2013, 9:54 AM 11/29/2013 Cipriano MileJohnson, Jenna Elizabeth OTR/L Pager [phone] Office [phone]

## 2013-12-02 ENCOUNTER — Other Ambulatory Visit: Payer: Self-pay | Admitting: *Deleted

## 2013-12-02 MED ORDER — TRAMADOL HCL 50 MG PO TABS: ORAL_TABLET | ORAL | Status: DC

## 2013-12-02 NOTE — Telephone Encounter (Signed)
[facility]  

## 2013-12-10 ENCOUNTER — Non-Acute Institutional Stay (SKILLED_NURSING_FACILITY): Payer: Medicare Other | Admitting: Internal Medicine

## 2013-12-10 DIAGNOSIS — R5381 Other malaise: Secondary | ICD-10-CM

## 2013-12-10 DIAGNOSIS — Z950 Presence of cardiac pacemaker: Secondary | ICD-10-CM

## 2013-12-10 DIAGNOSIS — R5383 Other fatigue: Secondary | ICD-10-CM

## 2013-12-10 DIAGNOSIS — I10 Essential (primary) hypertension: Secondary | ICD-10-CM

## 2013-12-10 DIAGNOSIS — R55 Syncope and collapse: Secondary | ICD-10-CM

## 2013-12-10 DIAGNOSIS — I495 Sick sinus syndrome: Secondary | ICD-10-CM

## 2013-12-10 DIAGNOSIS — R531 Weakness: Secondary | ICD-10-CM

## 2013-12-10 NOTE — Progress Notes (Signed)
Patient ID: Julian Taylor, male   DOB: March 12, 1925, 80 y.o.   MRN: [id number]  Provider:  Gwenith Spitziffany L. Renato Gailseed, D.O., C.M.D. Location:  [facility]  PCP: Georgianne FickAMACHANDRAN,AJITH, MD  Code Status: full code  No Known Allergies  Chief Complaint  Patient presents with  . Hospitalization Follow-up    new admission s/p hospitalization with syncope    HPI: 80 y.o. male with h/o htn, pernicious anemia, hyperlipidemia, BPH, tremor, osteoporosis and syncopal episode that was found to be due to symptomatic bradycardia was admitted here 3/27 for short term rehab s/p pacemaker placement by Dr. Johney FrameAllred.    ROS: Review of Systems  Constitutional: Negative for fever.  HENT: Negative for congestion.   Eyes: Negative for blurred vision.  Respiratory: Negative for shortness of breath.   Cardiovascular: Negative for chest pain, palpitations, orthopnea, leg swelling and PND.  Gastrointestinal: Negative for abdominal pain and constipation.  Genitourinary: Negative for dysuria.  Musculoskeletal: Positive for falls. Negative for myalgias.       With syncopal episode  Skin: Negative for rash.  Neurological: Positive for dizziness and loss of consciousness. Negative for headaches.  Endo/Heme/Allergies: Bruises/bleeds easily.  Psychiatric/Behavioral: Negative for depression and memory loss.     Past Medical History  Diagnosis Date  . Essential hypertension, benign   . Pernicious anemia   . Hyperlipidemia   . BPH (benign prostatic hyperplasia)     With frequent nocturia  . Nocturia     Frequent  . Syncope     First happened several years ago and was better when Losartan was stopped. Started taking Losartan and Flomax and had another syncopal episode in Feb 2015.  . Tremor     Long-standing  . Unsteady gait     Uses cane  . Wears hearing aid     Both ears  . Sick sinus syndrome   . Osteoarthritis   . Osteoporosis   . Pacemaker 11/28/2013    DR Johney FrameALLRED   Past Surgical History    Procedure Laterality Date  . Appendectomy    . Tonsillectomy and adenoidectomy    . Cholecystectomy    . Pacemaker insertion  11/28/2013    MDT ADDRL1 pacemaker implanted by Dr Johney FrameAllred for SSS and symptomatic bradycardia  . Insert / replace / remove pacemaker  11/28/2013    DR ALLRED   Social History:   reports that he has never smoked. He has never used smokeless tobacco. He reports that he does not drink alcohol or use illicit drugs.  Family History  Problem Relation Age of Onset  . CVA Father     Medications: Patient's Medications  New Prescriptions   No medications on file  Previous Medications   ACETAMINOPHEN (TYLENOL) 500 MG TABLET    Take 1,000 mg by mouth 2 (two) times daily as needed (pain).    AMLODIPINE (NORVASC) 5 MG TABLET    Take 5 mg by mouth at bedtime.    ASPIRIN EC 81 MG TABLET    Take 81 mg by mouth daily.   CALCIUM CARB-CHOLECALCIFEROL (CALCIUM-VITAMIN D) 600-400 MG-UNIT TABS    Take 1 tablet by mouth daily.   CYANOCOBALAMIN (,VITAMIN B-12,) 1000 MCG/ML INJECTION    Inject 1,000 mcg into the muscle every 30 (thirty) days. Last injection March 6,2015   FOLIC ACID (FOLVITE) 400 MCG TABLET    Take 400 mcg by mouth daily.   TAMSULOSIN (FLOMAX) 0.4 MG CAPS CAPSULE    Take 0.4 mg by mouth daily.  TRAMADOL (ULTRAM) 50 MG TABLET    Take one tablet by mouth every 8 hours as needed for pain  Modified Medications   No medications on file  Discontinued Medications   No medications on file     Physical Exam: There were no vitals filed for this visit. Physical Exam  Constitutional: He is oriented to person, place, and time.  Thin white male nad  HENT:  Head: Normocephalic and atraumatic.  Right Ear: External ear normal.  Left Ear: External ear normal.  Nose: Nose normal.  Mouth/Throat: Oropharynx is clear and moist. No oropharyngeal exudate.  Eyes: Conjunctivae and EOM are normal. Pupils are equal, round, and reactive to light.  Neck: Normal range of motion.  Neck supple. No JVD present. No thyromegaly present.  Cardiovascular: Normal rate, regular rhythm, normal heart sounds and intact distal pulses.  Exam reveals no gallop and no friction rub.   No murmur heard. Pacemaker site clean w/o drainage, erythema, warmth  Pulmonary/Chest: Effort normal and breath sounds normal. No respiratory distress. He has no wheezes. He has no rales.  Abdominal: Soft. Bowel sounds are normal. He exhibits no distension and no mass. There is no tenderness.  Genitourinary:  No suprapubic tenderness  Musculoskeletal: Normal range of motion.  Walks slowly with stooped posture using walker  Neurological: He is alert and oriented to person, place, and time. No cranial nerve deficit.  Skin: Skin is warm and dry.  Psychiatric: He has a normal mood and affect.     Labs reviewed: Basic Metabolic Panel:  Recent Labs  16/10/96 1147 11/29/13 0431  NA 134* 137  K 3.9 4.2  CL 101 99  CO2 25 21  GLUCOSE 111* 126*  BUN 20 20  CREATININE 1.2 1.14  CALCIUM 9.5 8.9   CBC:  Recent Labs  11/25/13 1147  WBC 7.6  NEUTROABS 5.3  HGB 12.5*  HCT 37.5*  MCV 104.1*  PLT 296.0  CBG:  Recent Labs  11/28/13 2148 11/29/13 0731 11/29/13 1133  GLUCAP 161* 124* 140*  Assessment/Plan 1. Sick sinus syndrome -resolved with pacer placement--had symptomatic bradycardia  2. Syncope -due to #1, no further episodes and no dizziness at this point  3. Essential hypertension -bp at goal, but not hypotensive with amlodipine 5mg  and his flomax for bph--monitor for orthostasis  4. Cardiac pacemaker in situ -placed and has f/u with Dr. Johney Frame upcoming -site looks good  5.  Generalized weakness -here for PT, OT after syncopal event and pacer placement -ambulates with walker  Labs/tests ordered: keep f/u with Dr. Johney Frame

## 2013-12-11 ENCOUNTER — Ambulatory Visit (INDEPENDENT_AMBULATORY_CARE_PROVIDER_SITE_OTHER): Payer: Medicare Other | Admitting: *Deleted

## 2013-12-11 ENCOUNTER — Other Ambulatory Visit: Payer: Self-pay | Admitting: Internal Medicine

## 2013-12-11 ENCOUNTER — Encounter: Payer: Self-pay | Admitting: Internal Medicine

## 2013-12-11 DIAGNOSIS — I495 Sick sinus syndrome: Secondary | ICD-10-CM

## 2013-12-11 LAB — MDC_IDC_ENUM_SESS_TYPE_REMOTE
Battery Remaining Longevity: 166 mo
Brady Statistic AP VP Percent: 0 %
Brady Statistic AP VS Percent: 0 %
Lead Channel Impedance Value: 793 Ohm
Lead Channel Pacing Threshold Amplitude: 0.5 V
Lead Channel Pacing Threshold Amplitude: 0.5 V
Lead Channel Pacing Threshold Pulse Width: 0.4 ms
Lead Channel Sensing Intrinsic Amplitude: 15.67 mV
Lead Channel Setting Pacing Amplitude: 3.5 V
Lead Channel Setting Pacing Pulse Width: 0.4 ms
MDC IDC MSMT BATTERY IMPEDANCE: 100 Ohm
MDC IDC MSMT BATTERY VOLTAGE: 2.79 V
MDC IDC MSMT LEADCHNL RA IMPEDANCE VALUE: 551 Ohm
MDC IDC MSMT LEADCHNL RA PACING THRESHOLD PULSEWIDTH: 0.4 ms
MDC IDC MSMT LEADCHNL RA SENSING INTR AMPL: 4 mV
MDC IDC SESS DTM: 20150408120734
MDC IDC SET LEADCHNL RV PACING AMPLITUDE: 3.5 V
MDC IDC SET LEADCHNL RV SENSING SENSITIVITY: 5.6 mV
MDC IDC STAT BRADY AS VP PERCENT: 0 %
MDC IDC STAT BRADY AS VS PERCENT: 100 %

## 2013-12-11 NOTE — Progress Notes (Signed)
Wound check appointment. Steri-strips removed. Wound without redness or edema. Incision edges approximated, wound well healed. Normal device function. Thresholds, sensing, and impedances consistent with implant measurements. Device programmed at 3.5V/auto capture programmed on for extra safety margin until 3 month visit. Histogram distribution appropriate for patient and level of activity. 80 mode switches, 1.4%, - coumadin.  No high ventricular rates noted. Patient educated about wound care, arm mobility, lifting restrictions. ROV in 3 months with implanting physician.

## 2013-12-13 ENCOUNTER — Non-Acute Institutional Stay (SKILLED_NURSING_FACILITY): Payer: Medicare Other | Admitting: Internal Medicine

## 2013-12-13 ENCOUNTER — Encounter: Payer: Self-pay | Admitting: Internal Medicine

## 2013-12-13 DIAGNOSIS — Z950 Presence of cardiac pacemaker: Secondary | ICD-10-CM

## 2013-12-13 DIAGNOSIS — I10 Essential (primary) hypertension: Secondary | ICD-10-CM

## 2013-12-13 DIAGNOSIS — J019 Acute sinusitis, unspecified: Secondary | ICD-10-CM

## 2013-12-13 DIAGNOSIS — I495 Sick sinus syndrome: Secondary | ICD-10-CM

## 2013-12-13 DIAGNOSIS — R55 Syncope and collapse: Secondary | ICD-10-CM

## 2013-12-13 NOTE — Progress Notes (Signed)
Patient ID: Julian Taylor, male   DOB: [birth year], 80 y.o.   MRN: [id number]  Location:  [facility] Julian Taylor, D.O., C.M.D.  PCP: [name], MD  No Known Allergies  Chief Complaint  Patient presents with  . Discharge Note    HPI:  80 yo male here for short term rehab s/p syncopal episode.  He was found to have sick sinus syndrome with symptomatic bradycardia.  He had a pacemaker placed.  He is doing very well.  He has no complaints aside from head congestion and cough.  He feels he has a cold, and it is getting better.  Later on after I saw him, his son arrived, and said he is not getting any better, and his doctor always gives him a zpak when he has sinusitis.  We discussed that this may no longer be the best med for him due to his recent arrhythmia problems.  His son understands, but wants him to get an antibiotic or will take him to the urgent care.  He has been getting cough syrup here with some benefit.  We also discussed that cough medicine with codeine (which son got himself) is contraindicated in a geriatric pt at risk of falling.    Review of Systems:  Review of Systems  Constitutional: Negative for fever.  HENT: Positive for congestion and hearing loss. Negative for ear pain and sore throat.   Eyes: Negative for blurred vision.       Wears glasses  Respiratory: Negative for shortness of breath.   Cardiovascular: Negative for chest pain, palpitations and leg swelling.  Gastrointestinal: Negative for heartburn and constipation.  Genitourinary: Negative for dysuria.  Musculoskeletal: Negative for falls and myalgias.       While here  Skin: Negative for rash.  Neurological: Negative for dizziness, loss of consciousness, weakness and headaches.       Since before hospital stay  Endo/Heme/Allergies: Bruises/bleeds easily.  Psychiatric/Behavioral: Negative for depression.     Past Medical History  Diagnosis Date  . Essential hypertension, benign    . Pernicious anemia   . Hyperlipidemia   . BPH (benign prostatic hyperplasia)     With frequent nocturia  . Nocturia     Frequent  . Syncope     First happened several years ago and was better when Losartan was stopped. Started taking Losartan and Flomax and had another syncopal episode in Feb 2015.  . Tremor     Long-standing  . Unsteady gait     Uses cane  . Wears hearing aid     Both ears  . Sick sinus syndrome   . Osteoarthritis   . Osteoporosis   . Pacemaker 11/28/2013    DR Johney Frame    Past Surgical History  Procedure Laterality Date  . Appendectomy    . Tonsillectomy and adenoidectomy    . Cholecystectomy    . Pacemaker insertion  11/28/2013    MDT ADDRL1 pacemaker implanted by Dr Johney Frame for SSS and symptomatic bradycardia  . Insert / replace / remove pacemaker  11/28/2013    DR ALLRED    Social History:   reports that he has never smoked. He has never used smokeless tobacco. He reports that he does not drink alcohol or use illicit drugs.  Family History  Problem Relation Age of Onset  . CVA Father     Medications: Patient's Medications  New Prescriptions   No medications on file  Previous Medications   ACETAMINOPHEN (TYLENOL)  500 MG TABLET    Take 1,000 mg by mouth 2 (two) times daily as needed (pain).    AMLODIPINE (NORVASC) 5 MG TABLET    Take 5 mg by mouth at bedtime.    ASPIRIN EC 81 MG TABLET    Take 81 mg by mouth daily.   CALCIUM CARB-CHOLECALCIFEROL (CALCIUM-VITAMIN D) 600-400 MG-UNIT TABS    Take 1 tablet by mouth daily.   CYANOCOBALAMIN (,VITAMIN B-12,) 1000 MCG/ML INJECTION    Inject 1,000 mcg into the muscle every 30 (thirty) days. Last injection March 6,2015   FOLIC ACID (FOLVITE) 400 MCG TABLET    Take 400 mcg by mouth daily.   TAMSULOSIN (FLOMAX) 0.4 MG CAPS CAPSULE    Take 0.4 mg by mouth daily.    TRAMADOL (ULTRAM) 50 MG TABLET    Take one tablet by mouth every 8 hours as needed for pain  Modified Medications   No medications on file    Discontinued Medications   No medications on file    Physical Exam: There were no vitals filed for this visit. Physical Exam  Constitutional: He is oriented to person, place, and time. He appears well-developed and well-nourished. No distress.  Elderly white male, very pleasant  HENT:  Head: Normocephalic and atraumatic.  Eyes: EOM are normal. Pupils are equal, round, and reactive to light.  Wearing glasses  Cardiovascular: Normal rate, regular rhythm, normal heart sounds and intact distal pulses.   Pacemaker site has healed in left upper chest  Pulmonary/Chest: Effort normal and breath sounds normal. No respiratory distress.  Abdominal: Soft. Bowel sounds are normal. He exhibits no distension and no mass. There is no tenderness.  Musculoskeletal: Normal range of motion. He exhibits no edema and no tenderness.  Walks with walker, stooped posture  Neurological: He is alert and oriented to person, place, and time.  Skin: Skin is warm and dry.    Labs reviewed: Basic Metabolic Panel:  Recent Labs  82/95/6203/23/15 1147 11/29/13 0431  NA 134* 137  K 3.9 4.2  CL 101 99  CO2 25 21  GLUCOSE 111* 126*  BUN 20 20  CREATININE 1.2 1.14  CALCIUM 9.5 8.9   CBC:  Recent Labs  11/25/13 1147  WBC 7.6  NEUTROABS 5.3  HGB 12.5*  HCT 37.5*  MCV 104.1*  PLT 296.0   CBG:  Recent Labs  11/28/13 2148 11/29/13 0731 11/29/13 1133  GLUCAP 161* 124* 140*   Procedures and Imaging Studies:  Please see admission note  Assessment/Plan:   1. Sick sinus syndrome -pacemaker in place and working well--has already followed up with cardiology  2. Syncope -due to symptomatic bradycardia/sick sinus syndrome -resolved with placement of pacemaker -has completed his short term rehab here and will go home now with home health services  3. Essential hypertension -at goal with current meds;  Monitor for orthostatic hypotension in view of his flomax use;  Otherwise only take 5mg  amlodipine  4.  Acute sinusitis -due to son's insistence, augmentin was prescribed for a 7 day course--I recommended yogurt be eaten to avoid development of antibiotic associated diarrhea -encourage hydration -use otc cough syrup or straight mucinex for congestion and cough, warm humidity, would not be able to do saline flushes himself  5. Cardiac pacemaker in situ -first f/u with cardiology was good--keep future appts.    Patient is being discharged with home health services:  Nursing, PT, OT  Patient is being discharged with the following durable medical equipment:  Rolling walker, bedside commode  Patient has been advised to f/u with their PCP in 1-2 weeks to bring them up to date on their rehab stay.  They were provided with a 30 day supply of scripts for prescription medications and refills must be obtained from their PCP.    Labs/tests ordered:  Needs f/u on hba1c due to hyperglycemia noted in labs, b12, folate due to his pernicious anemia; monitor for orthostasis and resolution of his acute sinusitis

## 2013-12-15 ENCOUNTER — Encounter: Payer: Self-pay | Admitting: Internal Medicine

## 2014-02-26 ENCOUNTER — Ambulatory Visit (INDEPENDENT_AMBULATORY_CARE_PROVIDER_SITE_OTHER): Payer: Medicare Other | Admitting: Internal Medicine

## 2014-02-26 ENCOUNTER — Encounter: Payer: Self-pay | Admitting: Internal Medicine

## 2014-02-26 VITALS — BP 140/80 | HR 100 | Ht 70.0 in | Wt 155.0 lb

## 2014-02-26 DIAGNOSIS — R55 Syncope and collapse: Secondary | ICD-10-CM

## 2014-02-26 DIAGNOSIS — Z950 Presence of cardiac pacemaker: Secondary | ICD-10-CM

## 2014-02-26 DIAGNOSIS — I495 Sick sinus syndrome: Secondary | ICD-10-CM

## 2014-02-26 NOTE — Progress Notes (Signed)
PCP: Georgianne FickAMACHANDRAN,AJITH, MD Primary Cardiologist:  Dr Mila Homerilley  Julian Taylor is a 80 y.o. male who presents today for routine electrophysiology followup.  Since pacemaker implantation in March,  the patient reports doing very well.   He has had no further syncope or presyncope. Today, he denies symptoms of palpitations, chest pain, shortness of breath,  lower extremity edema, or dizziness.  The patient is otherwise without complaint today.   ROS negative other than frequent nocturnal urination.  Past Medical History  Diagnosis Date  . Essential hypertension, benign   . Pernicious anemia   . Hyperlipidemia   . BPH (benign prostatic hyperplasia)     With frequent nocturia  . Nocturia     Frequent  . Syncope     First happened several years ago and was better when Losartan was stopped. Started taking Losartan and Flomax and had another syncopal episode in Feb 2015.  . Tremor     Long-standing  . Unsteady gait     Uses cane  . Wears hearing aid     Both ears  . Sick sinus syndrome   . Osteoarthritis   . Osteoporosis   . Pacemaker 11/28/2013    DR Johney FrameALLRED   Past Surgical History  Procedure Laterality Date  . Appendectomy    . Tonsillectomy and adenoidectomy    . Cholecystectomy    . Pacemaker insertion  11/28/2013    MDT ADDRL1 pacemaker implanted by Dr Johney FrameAllred for SSS and symptomatic bradycardia  . Insert / replace / remove pacemaker  11/28/2013    DR ALLRED    Current Outpatient Prescriptions  Medication Sig Dispense Refill  . acetaminophen (TYLENOL) 500 MG tablet Take 1,000 mg by mouth 2 (two) times daily as needed (pain).       Marland Kitchen. alfuzosin (UROXATRAL) 10 MG 24 hr tablet Take 1 tablet by mouth daily.      Marland Kitchen. aspirin EC 81 MG tablet Take 81 mg by mouth daily.      . Calcium Carb-Cholecalciferol (CALCIUM-VITAMIN D) 600-400 MG-UNIT TABS Take 1 tablet by mouth daily.      . cyanocobalamin (,VITAMIN B-12,) 1000 MCG/ML injection Inject 1,000 mcg into the muscle every 30 (thirty) days.  Last injection March 6,2015      . folic acid (FOLVITE) 400 MCG tablet Take 400 mcg by mouth daily.      . tamsulosin (FLOMAX) 0.4 MG CAPS capsule Take 0.4 mg by mouth daily.       . traMADol (ULTRAM) 50 MG tablet Take one tablet by mouth every 8 hours as needed for pain  90 tablet  5   No current facility-administered medications for this visit.    Physical Exam: Filed Vitals:   02/26/14 1545  BP: 141/66  Pulse: 112  Height: 5\' 10"  (1.778 m)  Weight: 155 lb (70.308 kg)    GEN- The patient is well appearing, alert and oriented x 3 today.   Head- normocephalic, atraumatic Eyes-  Sclera clear, conjunctiva pink Ears-  Hearing aids with HOH Oropharynx- clear Lungs- Clear to ausculation bilaterally, normal work of breathing Chest- pacemaker pocket is well healed Heart- Irregular rate and rhythm, consistent  With PC's, no murmurs, rubs or gallops, PMI not laterally displaced GI- soft, NT, ND, + BS Extremities- no clubbing, cyanosis, or edema  Pacemaker interrogation- reviewed in detail today,  See PACEART. EKG today shows Sinus tach at 100/min with PAC's.  Assessment and Plan:  1. Sick sinus/ sinus bradycardia Normal pacemaker function See [name]  report No changes today  2. Frequent PACs consider BB therapy.  I will defer this to Dr. Donnie Ahoilley. Follow with Dr. Donnie Ahoilley as scheduled.   I will see as needed going forward.

## 2014-02-26 NOTE — Patient Instructions (Signed)
Your physician recommends that you schedule a follow-up appointment as needed  

## 2014-02-28 LAB — MDC_IDC_ENUM_SESS_TYPE_INCLINIC
Battery Remaining Longevity: 176 mo
Brady Statistic AP VP Percent: 0 %
Brady Statistic AP VS Percent: 7 %
Brady Statistic AS VP Percent: 0 %
Date Time Interrogation Session: 20150624185815
Lead Channel Impedance Value: 738 Ohm
Lead Channel Pacing Threshold Amplitude: 0.75 V
Lead Channel Pacing Threshold Pulse Width: 0.4 ms
Lead Channel Sensing Intrinsic Amplitude: 15.67 mV
Lead Channel Sensing Intrinsic Amplitude: 2.8 mV
Lead Channel Setting Pacing Pulse Width: 0.4 ms
MDC IDC MSMT BATTERY IMPEDANCE: 100 Ohm
MDC IDC MSMT BATTERY VOLTAGE: 2.79 V
MDC IDC MSMT LEADCHNL RA IMPEDANCE VALUE: 510 Ohm
MDC IDC MSMT LEADCHNL RV PACING THRESHOLD AMPLITUDE: 0.5 V
MDC IDC MSMT LEADCHNL RV PACING THRESHOLD PULSEWIDTH: 0.4 ms
MDC IDC SET LEADCHNL RA PACING AMPLITUDE: 1.5 V
MDC IDC SET LEADCHNL RV PACING AMPLITUDE: 2 V
MDC IDC SET LEADCHNL RV SENSING SENSITIVITY: 5.6 mV
MDC IDC STAT BRADY AS VS PERCENT: 93 %

## 2014-05-28 ENCOUNTER — Encounter: Payer: Self-pay | Admitting: Internal Medicine

## 2014-05-28 ENCOUNTER — Ambulatory Visit (INDEPENDENT_AMBULATORY_CARE_PROVIDER_SITE_OTHER): Payer: Medicare Other | Admitting: *Deleted

## 2014-05-28 DIAGNOSIS — I495 Sick sinus syndrome: Secondary | ICD-10-CM

## 2014-05-28 NOTE — Progress Notes (Signed)
Remote pacemaker transmission.   

## 2014-05-29 LAB — MDC_IDC_ENUM_SESS_TYPE_REMOTE
Battery Remaining Longevity: 176 mo
Brady Statistic AP VS Percent: 9 %
Brady Statistic AS VS Percent: 91 %
Lead Channel Impedance Value: 718 Ohm
Lead Channel Pacing Threshold Amplitude: 0.375 V
Lead Channel Pacing Threshold Pulse Width: 0.4 ms
Lead Channel Sensing Intrinsic Amplitude: 11.2 mV
Lead Channel Sensing Intrinsic Amplitude: 2.8 mV
Lead Channel Setting Pacing Amplitude: 1.5 V
Lead Channel Setting Pacing Pulse Width: 0.4 ms
Lead Channel Setting Sensing Sensitivity: 4 mV
MDC IDC MSMT BATTERY IMPEDANCE: 100 Ohm
MDC IDC MSMT BATTERY VOLTAGE: 2.79 V
MDC IDC MSMT LEADCHNL RA IMPEDANCE VALUE: 510 Ohm
MDC IDC MSMT LEADCHNL RA PACING THRESHOLD AMPLITUDE: 0.5 V
MDC IDC MSMT LEADCHNL RA PACING THRESHOLD PULSEWIDTH: 0.4 ms
MDC IDC SESS DTM: 20150923121636
MDC IDC SET LEADCHNL RV PACING AMPLITUDE: 2 V
MDC IDC STAT BRADY AP VP PERCENT: 0 %
MDC IDC STAT BRADY AS VP PERCENT: 0 %

## 2014-05-30 ENCOUNTER — Encounter: Payer: Self-pay | Admitting: Cardiology

## 2014-08-14 ENCOUNTER — Encounter (HOSPITAL_COMMUNITY): Payer: Self-pay | Admitting: Internal Medicine

## 2014-09-01 ENCOUNTER — Encounter: Payer: Medicare Other | Admitting: *Deleted

## 2014-09-01 ENCOUNTER — Telehealth: Payer: Self-pay | Admitting: Cardiology

## 2014-09-01 NOTE — Telephone Encounter (Signed)
Spoke w/ pt son about remote transmission. Determined that pt remote monitoring is being followed by Dr. Donnie Ahoilley. Called pt son back to inform him of this.

## 2014-11-11 ENCOUNTER — Emergency Department (HOSPITAL_COMMUNITY)
Admission: EM | Admit: 2014-11-11 | Discharge: 2014-11-11 | Disposition: A | Discharge status: Home / Self Care | Payer: Medicare Other | Attending: Emergency Medicine | Admitting: Emergency Medicine

## 2014-11-11 ENCOUNTER — Emergency Department (HOSPITAL_COMMUNITY): Payer: Medicare Other

## 2014-11-11 ENCOUNTER — Encounter (HOSPITAL_COMMUNITY): Payer: Self-pay

## 2014-11-11 DIAGNOSIS — Z7982 Long term (current) use of aspirin: Secondary | ICD-10-CM | POA: Insufficient documentation

## 2014-11-11 DIAGNOSIS — Y998 Other external cause status: Secondary | ICD-10-CM | POA: Insufficient documentation

## 2014-11-11 DIAGNOSIS — I951 Orthostatic hypotension: Secondary | ICD-10-CM | POA: Diagnosis not present

## 2014-11-11 DIAGNOSIS — R55 Syncope and collapse: Secondary | ICD-10-CM | POA: Diagnosis present

## 2014-11-11 DIAGNOSIS — D649 Anemia, unspecified: Secondary | ICD-10-CM | POA: Insufficient documentation

## 2014-11-11 DIAGNOSIS — Z79899 Other long term (current) drug therapy: Secondary | ICD-10-CM | POA: Diagnosis not present

## 2014-11-11 DIAGNOSIS — S29001A Unspecified injury of muscle and tendon of front wall of thorax, initial encounter: Secondary | ICD-10-CM | POA: Diagnosis not present

## 2014-11-11 DIAGNOSIS — Z8639 Personal history of other endocrine, nutritional and metabolic disease: Secondary | ICD-10-CM | POA: Insufficient documentation

## 2014-11-11 DIAGNOSIS — I10 Essential (primary) hypertension: Secondary | ICD-10-CM | POA: Diagnosis not present

## 2014-11-11 DIAGNOSIS — Y92000 Kitchen of unspecified non-institutional (private) residence as  the place of occurrence of the external cause: Secondary | ICD-10-CM | POA: Diagnosis not present

## 2014-11-11 DIAGNOSIS — M1990 Unspecified osteoarthritis, unspecified site: Secondary | ICD-10-CM | POA: Insufficient documentation

## 2014-11-11 DIAGNOSIS — Z974 Presence of external hearing-aid: Secondary | ICD-10-CM | POA: Diagnosis not present

## 2014-11-11 DIAGNOSIS — Y9389 Activity, other specified: Secondary | ICD-10-CM | POA: Diagnosis not present

## 2014-11-11 DIAGNOSIS — Z950 Presence of cardiac pacemaker: Secondary | ICD-10-CM | POA: Diagnosis not present

## 2014-11-11 DIAGNOSIS — Z87438 Personal history of other diseases of male genital organs: Secondary | ICD-10-CM | POA: Diagnosis not present

## 2014-11-11 DIAGNOSIS — W19XXXA Unspecified fall, initial encounter: Secondary | ICD-10-CM | POA: Insufficient documentation

## 2014-11-11 LAB — URINALYSIS, ROUTINE W REFLEX MICROSCOPIC
Bilirubin Urine: NEGATIVE
GLUCOSE, UA: NEGATIVE mg/dL
Hgb urine dipstick: NEGATIVE
Ketones, ur: NEGATIVE mg/dL
Nitrite: NEGATIVE
PH: 8 (ref 5.0–8.0)
PROTEIN: NEGATIVE mg/dL
Specific Gravity, Urine: 1.015 (ref 1.005–1.030)
Urobilinogen, UA: 1 mg/dL (ref 0.0–1.0)

## 2014-11-11 LAB — BASIC METABOLIC PANEL
Anion gap: 9 (ref 5–15)
BUN: 34 mg/dL — ABNORMAL HIGH (ref 6–23)
CHLORIDE: 105 mmol/L (ref 96–112)
CO2: 22 mmol/L (ref 19–32)
CREATININE: 1.41 mg/dL — AB (ref 0.50–1.35)
Calcium: 9.4 mg/dL (ref 8.4–10.5)
GFR calc non Af Amer: 43 mL/min — ABNORMAL LOW (ref >90)
GFR, EST AFRICAN AMERICAN: 50 mL/min — AB (ref >90)
Glucose, Bld: 121 mg/dL — ABNORMAL HIGH (ref 70–99)
POTASSIUM: 4.8 mmol/L (ref 3.5–5.1)
Sodium: 136 mmol/L (ref 135–145)

## 2014-11-11 LAB — CBC
HCT: 33.7 % — ABNORMAL LOW (ref 39.0–52.0)
HEMOGLOBIN: 11.7 g/dL — AB (ref 13.0–17.0)
MCH: 34.9 pg — ABNORMAL HIGH (ref 26.0–34.0)
MCHC: 34.7 g/dL (ref 30.0–36.0)
MCV: 100.6 fL — ABNORMAL HIGH (ref 78.0–100.0)
Platelets: 226 10*3/uL (ref 150–400)
RBC: 3.35 MIL/uL — ABNORMAL LOW (ref 4.22–5.81)
RDW: 15.5 % (ref 11.5–15.5)
WBC: 8.2 10*3/uL (ref 4.0–10.5)

## 2014-11-11 LAB — URINE MICROSCOPIC-ADD ON

## 2014-11-11 LAB — TROPONIN I: Troponin I: <0.03 ng/mL (ref <0.031)

## 2014-11-11 LAB — CBG MONITORING, ED: Glucose-Capillary: 113 mg/dL — ABNORMAL HIGH (ref 70–99)

## 2014-11-11 MED ORDER — SODIUM CHLORIDE 0.9 % IV BOLUS (SEPSIS)
1000.0000 mL | Freq: Once | INTRAVENOUS | Status: AC
  Administered 2014-11-11: 1000 mL via INTRAVENOUS

## 2014-11-11 NOTE — ED Notes (Signed)
PA STudent at the bedside.

## 2014-11-11 NOTE — ED Notes (Signed)
Patient returned from CT. Patient place back on monitor by Chastity, NT.

## 2014-11-11 NOTE — ED Notes (Addendum)
[place], RN at the Building services engineerbedside interrogating pacemaker.

## 2014-11-11 NOTE — ED Notes (Signed)
CBG: 113 °

## 2014-11-11 NOTE — ED Notes (Signed)
MD requested repeat EKG.

## 2014-11-11 NOTE — ED Notes (Signed)
Meal tray given to patient.

## 2014-11-11 NOTE — ED Notes (Signed)
[facility] called and asked to bring tray ASAP. Verbalized understanding.

## 2014-11-11 NOTE — Progress Notes (Signed)
MDT dual chamber pacemaker interrogation is reviewed and normal. No arrhythmias to explain his syncope.  EKG reveals no ischemic findings.  Per Dr Rhunette CroftNanavati, syncope was postural.  Pt appears dry by labs and orthostatics. Would gently hydrate.  If no new symptoms, then could likely discharge later today once no longer orthostatic.  I will arrange follow-up in my office on 11/17/14 at 3:30 pm.   Hillis RangeJames Vaughn Frieze MD 11/11/2014 3:31 PM

## 2014-11-11 NOTE — Discharge Instructions (Signed)
You are dehydrated, and likely passed out because of that. Please drink plenty of fluids. Please see Dr. Johney Frame or Dr. Donnie Aho soon.  Syncope Syncope is a medical term for fainting or passing out. This means you lose consciousness and drop to the ground. People are generally unconscious for less than 5 minutes. You may have some muscle twitches for up to 15 seconds before waking up and returning to normal. Syncope occurs more often in older adults, but it can happen to anyone. While most causes of syncope are not dangerous, syncope can be a sign of a serious medical problem. It is important to seek medical care.  CAUSES  Syncope is caused by a sudden drop in blood flow to the brain. The specific cause is often not determined. Factors that can bring on syncope include:  Taking medicines that lower blood pressure.  Sudden changes in posture, such as standing up quickly.  Taking more medicine than prescribed.  Standing in one place for too long.  Seizure disorders.  Dehydration and excessive exposure to heat.  Low blood sugar (hypoglycemia).  Straining to have a bowel movement.  Heart disease, irregular heartbeat, or other circulatory problems.  Fear, emotional distress, seeing blood, or severe pain. SYMPTOMS  Right before fainting, you may:  Feel dizzy or light-headed.  Feel nauseous.  See all white or all black in your field of vision.  Have cold, clammy skin. DIAGNOSIS  Your health care provider will ask about your symptoms, perform a physical exam, and perform an electrocardiogram (ECG) to record the electrical activity of your heart. Your health care provider may also perform other heart or blood tests to determine the cause of your syncope which may include:  Transthoracic echocardiogram (TTE). During echocardiography, sound waves are used to evaluate how blood flows through your heart.  Transesophageal echocardiogram (TEE).  Cardiac monitoring. This allows your health  care provider to monitor your heart rate and rhythm in real time.  Holter monitor. This is a portable device that records your heartbeat and can help diagnose heart arrhythmias. It allows your health care provider to track your heart activity for several days, if needed.  Stress tests by exercise or by giving medicine that makes the heart beat faster. TREATMENT  In most cases, no treatment is needed. Depending on the cause of your syncope, your health care provider may recommend changing or stopping some of your medicines. HOME CARE INSTRUCTIONS  Have someone stay with you until you feel stable.  Do not drive, use machinery, or play sports until your health care provider says it is okay.  Keep all follow-up appointments as directed by your health care provider.  Lie down right away if you start feeling like you might faint. Breathe deeply and steadily. Wait until all the symptoms have passed.  Drink enough fluids to keep your urine clear or pale yellow.  If you are taking blood pressure or heart medicine, get up slowly and take several minutes to sit and then stand. This can reduce dizziness. SEEK IMMEDIATE MEDICAL CARE IF:   You have a severe headache.  You have unusual pain in the chest, abdomen, or back.  You are bleeding from your mouth or rectum, or you have black or tarry stool.  You have an irregular or very fast heartbeat.  You have pain with breathing.  You have repeated fainting or seizure-like jerking during an episode.  You faint when sitting or lying down.  You have confusion.  You have trouble walking.  You have severe weakness.  You have vision problems. If you fainted, call your local emergency services (911 in [place] Do not drive yourself to the hospital.  MAKE SURE YOU:  Understand these instructions.  Will watch your condition.  Will get help right away if you are not doing well or get worse. Document Released: 08/22/2005 Document Revised:  08/27/2013 Document Reviewed: 10/21/2011 [facility] Patient Information 2015 [place], [place]. This information is not intended to replace advice given to you by your health care provider. Make sure you discuss any questions you have with your health care provider. Orthostatic Hypotension Orthostatic hypotension is a sudden drop in blood pressure. It happens when you quickly stand up from a seated or lying position. You may feel dizzy or light-headed. This can last for just a few seconds or for up to a few minutes. It is usually not a serious problem. However, if this happens frequently or gets worse, it can be a sign of something more serious. CAUSES  Different things can cause orthostatic hypotension, including:   Loss of body fluids (dehydration).  Medicines that lower blood pressure.  Sudden changes in posture, such as standing up quickly after you have been sitting or lying down.  Taking too much of your medicine. SIGNS AND SYMPTOMS   Light-headedness or dizziness.   Fainting or near-fainting.   A fast heart rate.   Weakness.   Feeling tired (fatigue).  DIAGNOSIS  Your health care provider may do several things to help diagnose your condition and identify the cause. These may include:   Taking a medical history and doing a physical exam.  Checking your blood pressure. Your health care provider will check your blood pressure when you are:  Lying down.  Sitting.  Standing.  Using tilt table testing. In this test, you lie down on a table that moves from a lying position to a standing position. You will be strapped onto the table. This test monitors your blood pressure and heart rate when you are in different positions. TREATMENT  Treatment will vary depending on the cause. Possible treatments include:   Changing the dosage of your medicines.  Wearing compression stockings on your lower legs.  Standing up slowly after sitting or lying down.  Eating more  salt.  Eating frequent, small meals.  In some cases, getting IV fluids.  Taking medicine to enhance fluid retention. HOME CARE INSTRUCTIONS  Only take over-the-counter or prescription medicines as directed by your health care provider.  Follow your health care provider's instructions for changing the dosage of your current medicines.  Do not stop or adjust your medicine on your own.  Stand up slowly after sitting or lying down. This allows your body to adjust to the different position.  Wear compression stockings as directed.  Eat extra salt as directed.  Do not add extra salt to your diet unless directed to by your health care provider.  Eat frequent, small meals.  Avoid standing suddenly after eating.  Avoid hot showers or excessive heat as directed by your health care provider.  Keep all follow-up appointments. SEEK MEDICAL CARE IF:  You continue to feel dizzy or light-headed after standing.  You feel groggy or confused.  You feel cold, clammy, or sick to your stomach (nauseous).  You have blurred vision.  You feel short of breath. SEEK IMMEDIATE MEDICAL CARE IF:   You faint after standing.  You have chest pain.  You have difficulty breathing.   You lose feeling or movement in  your arms or legs.   You have slurred speech or difficulty talking, or you are unable to talk.  MAKE SURE YOU:   Understand these instructions.  Will watch your condition.  Will get help right away if you are not doing well or get worse. Document Released: 08/12/2002 Document Revised: 08/27/2013 Document Reviewed: 06/14/2013 [facility] Patient Information 2015 [place]. This information is not intended to replace advice given to you by your health care provider. Make sure you discuss any questions you have with your health care provider.

## 2014-11-11 NOTE — ED Notes (Signed)
Phlebotomy at the bedside  

## 2014-11-11 NOTE — ED Notes (Signed)
Xray called and sending for patient.

## 2014-11-11 NOTE — ED Notes (Signed)
Diet tray ordered for patient.

## 2014-11-11 NOTE — ED Provider Notes (Signed)
CSN: [id number]     Arrival date & time 11/11/14  1117 History   First MD Initiated Contact with Patient 11/11/14 1145     Chief Complaint  Patient presents with  . Loss of Consciousness     (Consider location/radiation/quality/duration/timing/severity/associated sxs/prior Treatment) HPI Comments: 81 y.o. male presenting with LOC. Pt has PMH of sick sinus syndrome, primary HTN, and pacemaker placement. Pt states that he Woke up, got dressed, and went to make coffee (10 am) when he fell forward towards cabinets. Pt reports he noticed he "was off", and soon after "blacked out", and then woke up on the kitchen floor. Pt states he does not recall how long he was unconscious.  Pt denies any feelings of dizziness or light headedness before the fall. . Pt states he felt weak after awaking, Slid to living room to call EMS, defecated on himself, was transported here via EMS. Denies changes in vision or hearing, numbness, or tingling. Denies PMH of blood clots, CVA, and DM.  The history is provided by the patient.    Past Medical History  Diagnosis Date  . Essential hypertension, benign   . Pernicious anemia   . Hyperlipidemia   . BPH (benign prostatic hyperplasia)     With frequent nocturia  . Nocturia     Frequent  . Syncope     First happened several years ago and was better when Losartan was stopped. Started taking Losartan and Flomax and had another syncopal episode in Feb 2015.  . Tremor     Long-standing  . Unsteady gait     Uses cane  . Wears hearing aid     Both ears  . Sick sinus syndrome   . Osteoarthritis   . Osteoporosis   . Pacemaker 11/28/2013    DR ALLRED  . Osteoarthritis   . Anemia    Past Surgical History  Procedure Laterality Date  . Appendectomy    . Tonsillectomy and adenoidectomy    . Cholecystectomy    . Pacemaker insertion  11/28/2013    MDT ADDRL1 pacemaker implanted by Dr Johney Frame for SSS and symptomatic bradycardia  . Insert / replace / remove pacemaker   11/28/2013    DR ALLRED  . Permanent pacemaker insertion N/A 11/28/2013    Procedure: PERMANENT PACEMAKER INSERTION;  Surgeon: Gardiner Rhyme, MD;  Location: [facility] CATH [facility];  Service: Cardiovascular;  Laterality: N/A;   Family History  Problem Relation Age of Onset  . CVA Father    History  Substance Use Topics  . Smoking status: Never Smoker   . Smokeless tobacco: Never Used  . Alcohol Use: No    Review of Systems  Constitutional: Negative for activity change and appetite change.  Respiratory: Negative for cough and shortness of breath.   Cardiovascular: Positive for chest pain.  Gastrointestinal: Negative for abdominal pain.  Genitourinary: Negative for dysuria.  All other systems reviewed and are negative.     Allergies  Review of patient's allergies indicates no known allergies.  Home Medications   Prior to Admission medications   Medication Sig Start Date End Date Taking? Authorizing Provider  acetaminophen (TYLENOL) 500 MG tablet Take 1,000 mg by mouth 2 (two) times daily as needed (pain).    Yes Historical Provider, MD  alfuzosin (UROXATRAL) 10 MG 24 hr tablet Take 1 tablet by mouth daily. 02/24/14  Yes Historical Provider, MD  aspirin EC 81 MG tablet Take 81 mg by mouth daily.   Yes Historical Provider, MD  Calcium  Carb-Cholecalciferol (CALCIUM-VITAMIN D) 600-400 MG-UNIT TABS Take 1 tablet by mouth daily.   Yes Historical Provider, MD  cyanocobalamin (,VITAMIN B-12,) 1000 MCG/ML injection Inject 1,000 mcg into the muscle every 30 (thirty) days. Last injection March 6,2015   Yes Historical Provider, MD  ferrous sulfate 325 (65 FE) MG tablet Take 325 mg by mouth daily with breakfast.   Yes Historical Provider, MD  folic acid (FOLVITE) 400 MCG tablet Take 400 mcg by mouth daily.   Yes Historical Provider, MD  tamsulosin (FLOMAX) 0.4 MG CAPS capsule Take 0.4 mg by mouth daily.  10/07/13  Yes Historical Provider, MD  traMADol (ULTRAM) 50 MG tablet Take one tablet by mouth every 8  hours as needed for pain 12/02/13  Yes Tiffany L Reed, DO   BP 164/55 mmHg  Pulse 69  Temp(Src) 97.9 F (36.6 C) (Oral)  Resp 21  SpO2 100% Physical Exam  Constitutional: He is oriented to person, place, and time. He appears well-developed.  HENT:  Head: Normocephalic and atraumatic.  Eyes: Conjunctivae and EOM are normal. Pupils are equal, round, and reactive to light.  Neck: Normal range of motion. Neck supple.  Cardiovascular: Normal rate and regular rhythm.   Pulmonary/Chest: Effort normal and breath sounds normal.  Abdominal: Soft. Bowel sounds are normal. He exhibits no distension. There is no tenderness. There is no rebound and no guarding.  Neurological: He is alert and oriented to person, place, and time.  Skin: Skin is warm.  Nursing note and vitals reviewed.   ED Course  Procedures (including critical care time) Labs Review Labs Reviewed  CBC - Abnormal; Notable for the following:    RBC 3.35 (*)    Hemoglobin 11.7 (*)    HCT 33.7 (*)    MCV 100.6 (*)    MCH 34.9 (*)    All other components within normal limits  BASIC METABOLIC PANEL - Abnormal; Notable for the following:    Glucose, Bld 121 (*)    BUN 34 (*)    Creatinine, Ser 1.41 (*)    GFR calc non Af Amer 43 (*)    GFR calc Af Amer 50 (*)    All other components within normal limits  URINALYSIS, ROUTINE W REFLEX MICROSCOPIC - Abnormal; Notable for the following:    Leukocytes, UA TRACE (*)    All other components within normal limits  CBG MONITORING, ED - Abnormal; Notable for the following:    Glucose-Capillary 113 (*)    All other components within normal limits  TROPONIN I  URINE MICROSCOPIC-ADD ON    Imaging Review Dg Ribs Unilateral W/chest Right  11/11/2014   CLINICAL DATA:  81 year old male with right-sided rib pain after falling in his kitchen earlier today  EXAM: RIGHT RIBS AND CHEST - 3+ VIEW  COMPARISON:  Prior chest x-ray 11/29/2013  FINDINGS: Left subclavian approach cardiac rhythm  maintenance device with leads projecting over the right atrium and right ventricle. Positioning is unchanged compared to prior. Cardiac and mediastinal contours remain within normal limits. Atherosclerotic calcification present in the transverse aorta. No pneumothorax, pleural effusion, focal airspace consolidation or pulmonary edema. Central bronchitic changes are similar compared to prior.  No acute or displaced rib fracture identified.  IMPRESSION: Negative.   Electronically Signed   By: Malachy Moan M.D.   On: 11/11/2014 15:16   Ct Head Wo Contrast  11/11/2014   CLINICAL DATA:  Recent syncopal event with fall to the floor.  EXAM: CT HEAD WITHOUT CONTRAST  TECHNIQUE: Contiguous  axial images were obtained from the base of the skull through the vertex without intravenous contrast.  COMPARISON:  05/09/2012  FINDINGS: The bony calvarium is intact. No gross soft tissue abnormality is seen. Diffuse atrophic changes and chronic white matter ischemic changes are again noted. No findings to suggest acute hemorrhage, acute infarction or space-occupying mass lesion are noted.  IMPRESSION: Atrophic and ischemic changes.  No acute abnormality noted.   Electronically Signed   By: Alcide CleverMark  Lukens M.D.   On: 11/11/2014 13:49     EKG Interpretation   Date/Time:  Tuesday November 11 2014 11:22:51 EST Ventricular Rate:  101 PR Interval:  109 QRS Duration: 106 QT Interval:  362 QTC Calculation: 469 R Axis:   -72 Text Interpretation:  Sinus or ectopic atrial tachycardia Incomplete RBBB  and LAFB Consider RVH w/ secondary repol abnormality ST elevation suggests  acute pericarditis New t wave inversion in the anterior leads Confirmed by  Kloie Whiting, MD, Janey GentaANKIT [phone]([id number]) on 11/11/2014 11:45:22 AM      MDM   Final diagnoses:  Fall  Syncope and collapse  Orthostatic hypotension    DDx includes: Orthostatic hypotension Stroke Vertebral artery dissection/stenosis Dysrhythmia PE Vasovagal/neurocardiogenic  syncope Aortic stenosis Valvular disorder/Cardiomyopathy  Pt comes in with cc of syncope. Pt has hx of sick sinus syndrome. He has a pacemaker in place. Pt had some dizziness, but no cardiac prodrome. He had no seizure like sx, or post ictal phase. Will get the device interrogated.   @4 :30 Pt has orthostatics. Device interrogation done, and is negative for any acute dysrhythmias.\ Discussed case with Dr. Johney FrameAllred, who agrees that it will be safe to send pt home given the clinical findings. Pt is getting hydrated.  Derwood KaplanAnkit Jennie Bolar, MD 11/11/14 1630

## 2014-11-11 NOTE — ED Notes (Signed)
MD at the bedside  

## 2014-11-11 NOTE — ED Notes (Signed)
Per EMS, Patient was standing in kitchen to get a cup of coffee. Started to feel sleepy, and patient woke up in the floor. Patient called 911. Patient denies any back pain or neck pain and was Alert and Oriented x4. Patient reports incontinence during episode. EKG was paced Rhythm. Vitals: CBG 158, 158/70, 92 HR, 99 % on RA, 28 RR. Patient lives alone. Patient has a history of the same prior to having pacemaker placed, but has not had one since. Pacemaker was placed one year ago this month and he had vagal episodes up until placement.

## 2014-11-17 ENCOUNTER — Encounter: Payer: Medicare Other | Admitting: Internal Medicine

## 2016-04-12 IMAGING — CT CT HEAD W/O CM
1 series · 16 of 30 positions shown, 20 images · non-contrast
Comparison: 05/09/2012

CLINICAL DATA: Recent syncopal event with fall to the floor.

EXAM:
CT HEAD WITHOUT CONTRAST
TECHNIQUE: Contiguous axial images were obtained from the base of the skull
through the vertex without intravenous contrast.

[Series 2: head 5.0 h30s · axial · 0.45mm/px · z∈[-145,+0]mm · 16 of 33 slices shown, 20 images]
[im 2/33  brain]
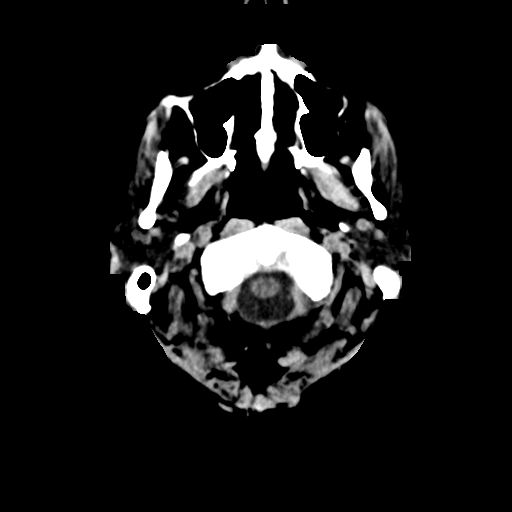
[im 2/33  bone]
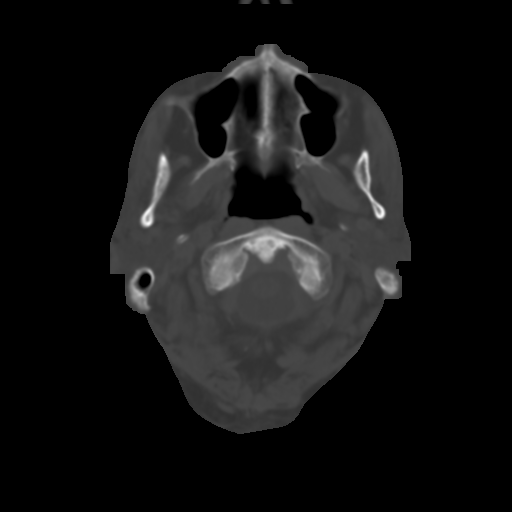
[im 4/33  brain]
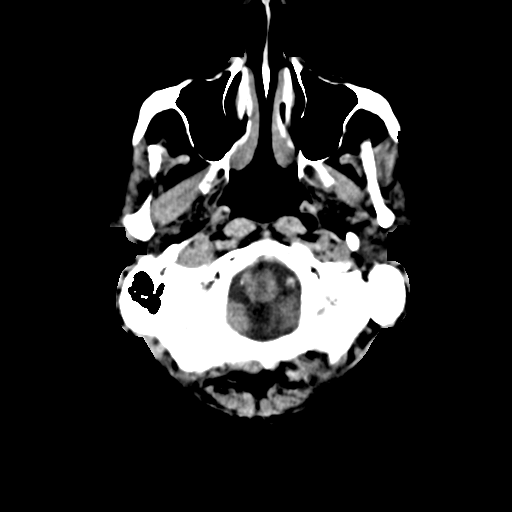
[im 6/33  brain]
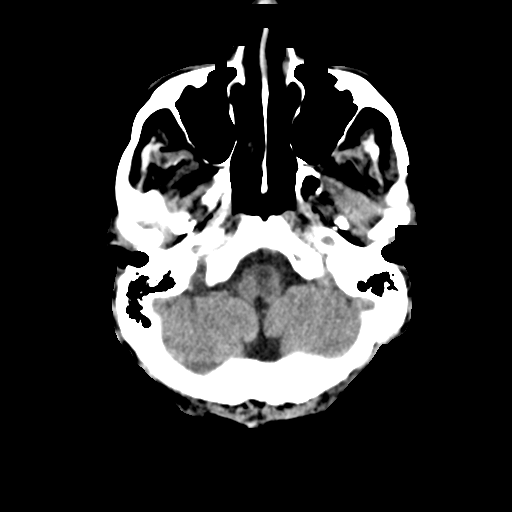
[im 8/33  brain]
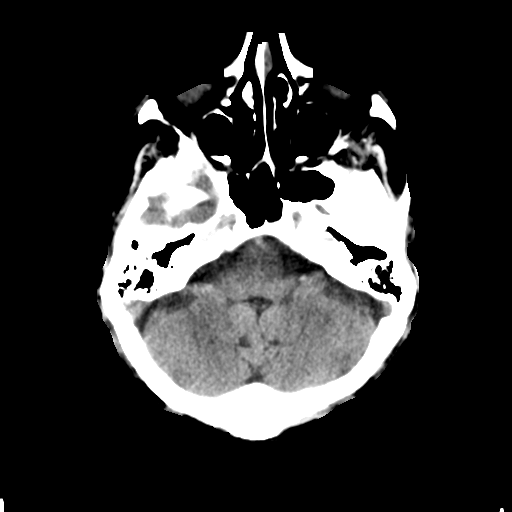
[im 9/33  brain]
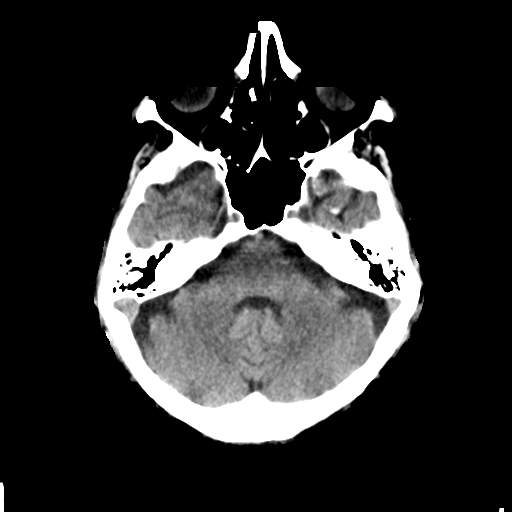
[im 9/33  bone]
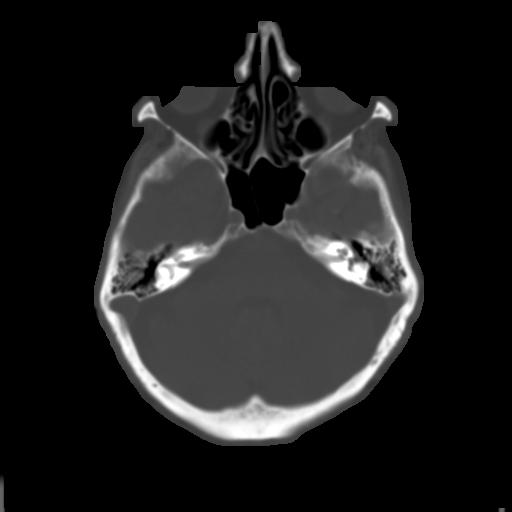
[im 12/33  brain]
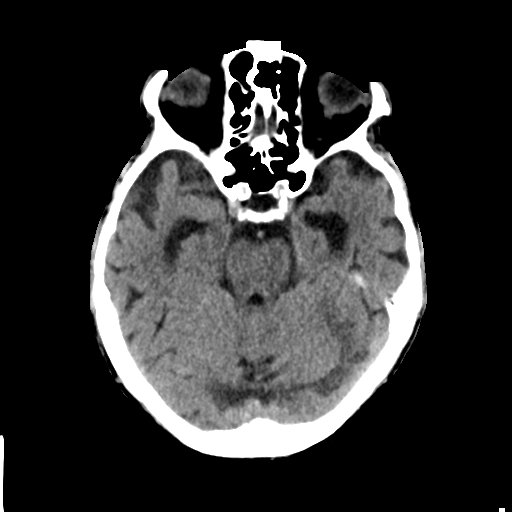
[im 14/33  brain]
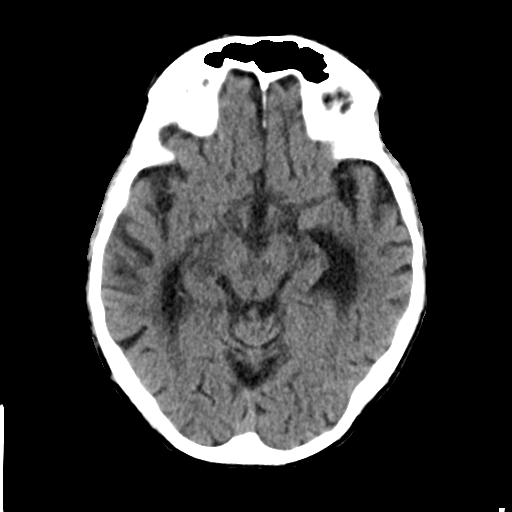
[im 16/33  brain]
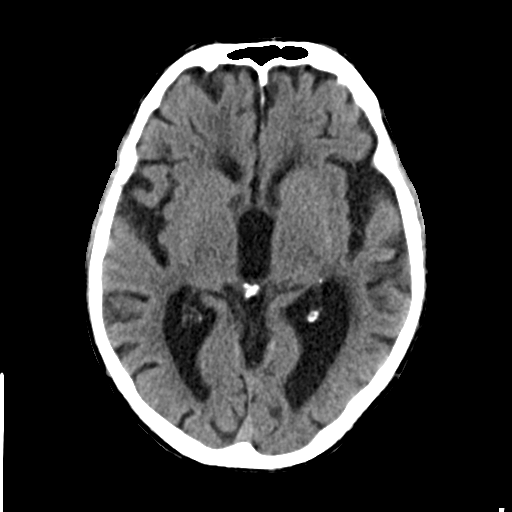
[im 17/33  brain]
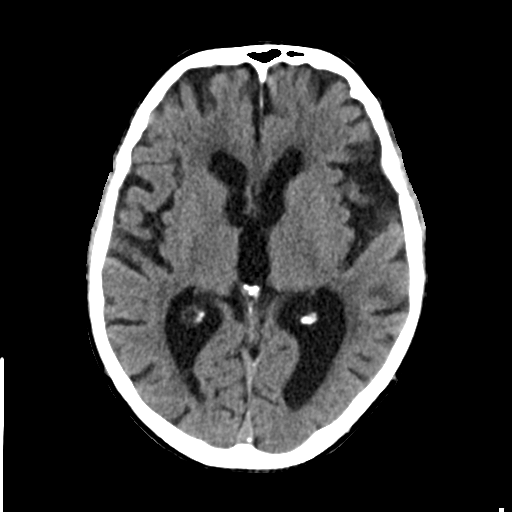
[im 17/33  bone]
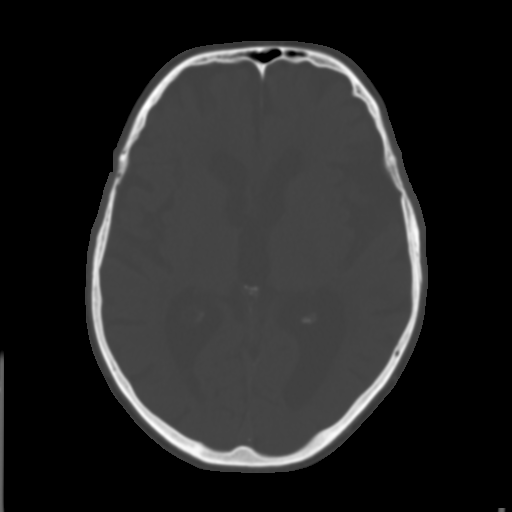
[im 19/33  brain]
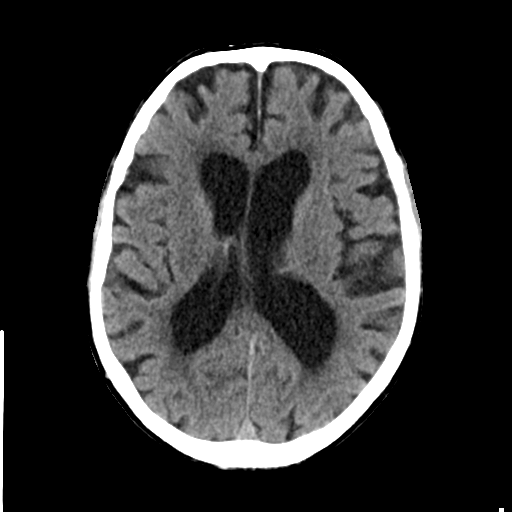
[im 21/33  brain]
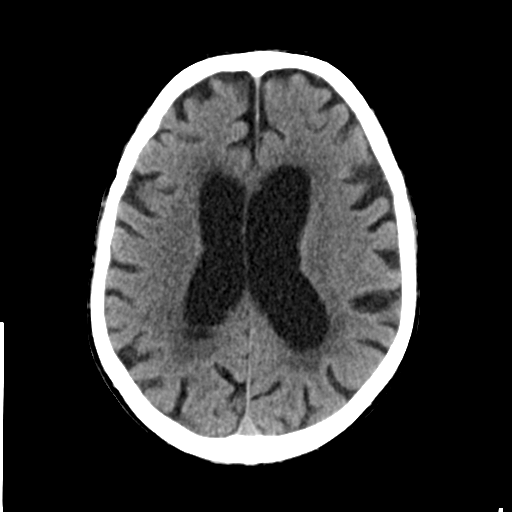
[im 24/33  brain]
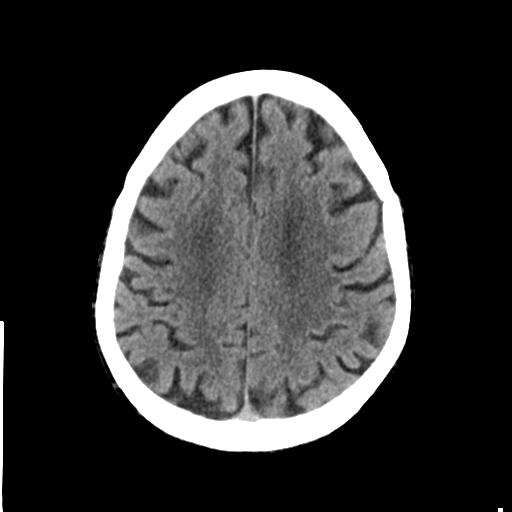
[im 25/33  brain]
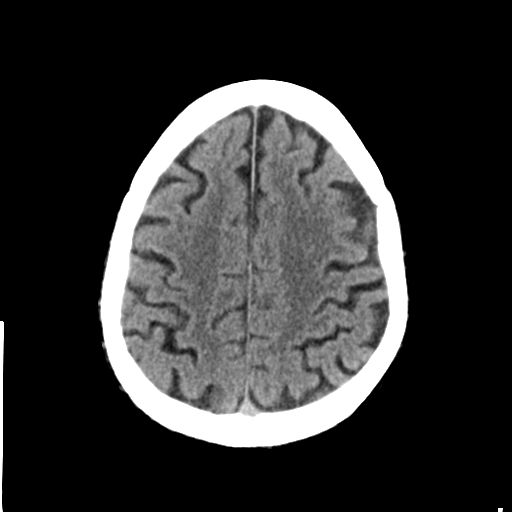
[im 25/33  bone]
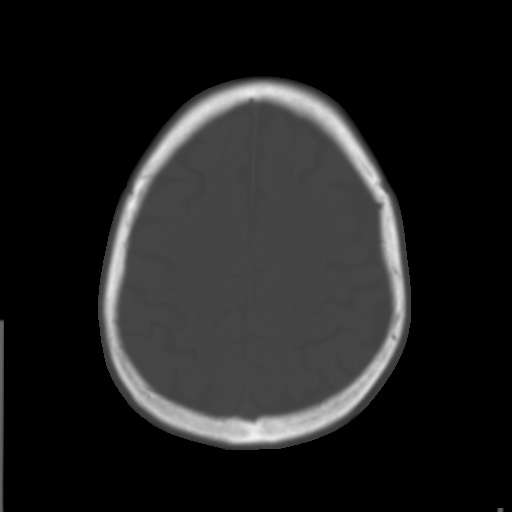
[im 27/33  brain]
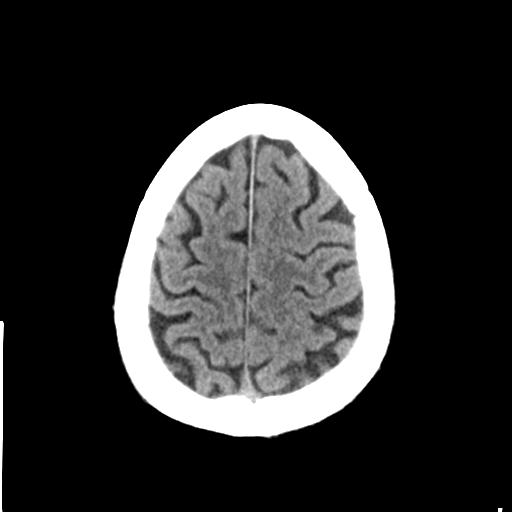
[im 29/33  brain]
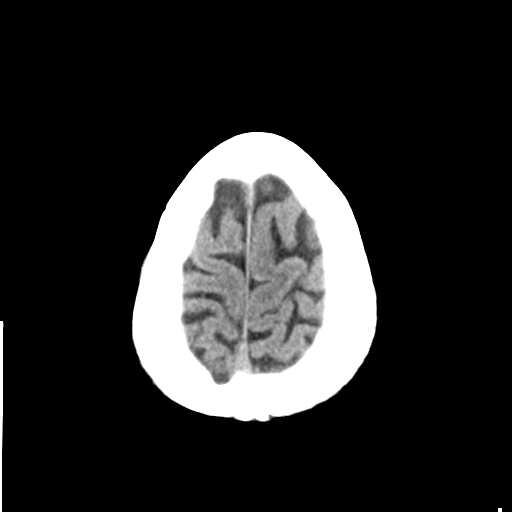
[im 31/33  brain]
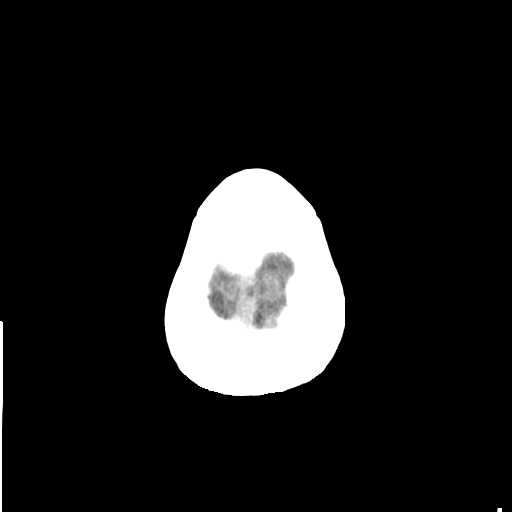

[16 of 30 positions shown; findings below may reference images not displayed]

FINDINGS: The bony calvarium is intact. No gross soft tissue abnormality is
seen. Diffuse atrophic changes and chronic white matter ischemic
changes are again noted. No findings to suggest acute hemorrhage,
acute infarction or space-occupying mass lesion are noted.
IMPRESSION: Atrophic and ischemic changes.  No acute abnormality noted.

## 2016-05-18 ENCOUNTER — Encounter (HOSPITAL_COMMUNITY): Payer: Self-pay | Admitting: Emergency Medicine

## 2016-05-18 ENCOUNTER — Emergency Department (HOSPITAL_COMMUNITY): Payer: Medicare Other

## 2016-05-18 ENCOUNTER — Inpatient Hospital Stay (HOSPITAL_COMMUNITY)
Admission: EM | Admit: 2016-05-18 | Discharge: 2016-05-20 | DRG: 812 | Disposition: A | Discharge status: Skilled Nursing Facility | Payer: Medicare Other | Attending: Internal Medicine | Admitting: Internal Medicine

## 2016-05-18 DIAGNOSIS — N401 Enlarged prostate with lower urinary tract symptoms: Secondary | ICD-10-CM | POA: Diagnosis present

## 2016-05-18 DIAGNOSIS — Z6820 Body mass index (BMI) 20.0-20.9, adult: Secondary | ICD-10-CM | POA: Diagnosis not present

## 2016-05-18 DIAGNOSIS — R7989 Other specified abnormal findings of blood chemistry: Secondary | ICD-10-CM | POA: Diagnosis not present

## 2016-05-18 DIAGNOSIS — Z823 Family history of stroke: Secondary | ICD-10-CM | POA: Diagnosis not present

## 2016-05-18 DIAGNOSIS — Z950 Presence of cardiac pacemaker: Secondary | ICD-10-CM | POA: Diagnosis not present

## 2016-05-18 DIAGNOSIS — I248 Other forms of acute ischemic heart disease: Secondary | ICD-10-CM | POA: Diagnosis present

## 2016-05-18 DIAGNOSIS — R079 Chest pain, unspecified: Secondary | ICD-10-CM | POA: Diagnosis not present

## 2016-05-18 DIAGNOSIS — E785 Hyperlipidemia, unspecified: Secondary | ICD-10-CM | POA: Diagnosis present

## 2016-05-18 DIAGNOSIS — Z79899 Other long term (current) drug therapy: Secondary | ICD-10-CM | POA: Diagnosis not present

## 2016-05-18 DIAGNOSIS — I129 Hypertensive chronic kidney disease with stage 1 through stage 4 chronic kidney disease, or unspecified chronic kidney disease: Secondary | ICD-10-CM | POA: Diagnosis present

## 2016-05-18 DIAGNOSIS — H9190 Unspecified hearing loss, unspecified ear: Secondary | ICD-10-CM | POA: Diagnosis present

## 2016-05-18 DIAGNOSIS — W19XXXA Unspecified fall, initial encounter: Secondary | ICD-10-CM | POA: Diagnosis present

## 2016-05-18 DIAGNOSIS — W06XXXA Fall from bed, initial encounter: Secondary | ICD-10-CM | POA: Diagnosis present

## 2016-05-18 DIAGNOSIS — D696 Thrombocytopenia, unspecified: Secondary | ICD-10-CM | POA: Diagnosis present

## 2016-05-18 DIAGNOSIS — I495 Sick sinus syndrome: Secondary | ICD-10-CM | POA: Diagnosis present

## 2016-05-18 DIAGNOSIS — N179 Acute kidney failure, unspecified: Secondary | ICD-10-CM | POA: Diagnosis present

## 2016-05-18 DIAGNOSIS — D649 Anemia, unspecified: Secondary | ICD-10-CM | POA: Diagnosis present

## 2016-05-18 DIAGNOSIS — Z7982 Long term (current) use of aspirin: Secondary | ICD-10-CM

## 2016-05-18 DIAGNOSIS — M810 Age-related osteoporosis without current pathological fracture: Secondary | ICD-10-CM | POA: Diagnosis present

## 2016-05-18 DIAGNOSIS — R531 Weakness: Secondary | ICD-10-CM

## 2016-05-18 DIAGNOSIS — N189 Chronic kidney disease, unspecified: Secondary | ICD-10-CM | POA: Diagnosis present

## 2016-05-18 DIAGNOSIS — D510 Vitamin B12 deficiency anemia due to intrinsic factor deficiency: Secondary | ICD-10-CM | POA: Diagnosis present

## 2016-05-18 DIAGNOSIS — R778 Other specified abnormalities of plasma proteins: Secondary | ICD-10-CM | POA: Diagnosis present

## 2016-05-18 DIAGNOSIS — I444 Left anterior fascicular block: Secondary | ICD-10-CM | POA: Diagnosis present

## 2016-05-18 DIAGNOSIS — N289 Disorder of kidney and ureter, unspecified: Secondary | ICD-10-CM

## 2016-05-18 DIAGNOSIS — R351 Nocturia: Secondary | ICD-10-CM | POA: Diagnosis present

## 2016-05-18 HISTORY — DX: Unspecified hearing loss, unspecified ear: H91.90

## 2016-05-18 LAB — URINALYSIS, ROUTINE W REFLEX MICROSCOPIC
Bilirubin Urine: NEGATIVE
GLUCOSE, UA: NEGATIVE mg/dL
Hgb urine dipstick: NEGATIVE
Ketones, ur: NEGATIVE mg/dL
LEUKOCYTES UA: NEGATIVE
Nitrite: NEGATIVE
PH: 7.5 (ref 5.0–8.0)
Protein, ur: NEGATIVE mg/dL
Specific Gravity, Urine: 1.013 (ref 1.005–1.030)

## 2016-05-18 LAB — BASIC METABOLIC PANEL
Anion gap: 10 (ref 5–15)
BUN: 28 mg/dL — AB (ref 4–21)
BUN: 28 mg/dL — AB (ref 6–20)
CO2: 23 mmol/L (ref 22–32)
CREATININE: 1.4 mg/dL — AB (ref 0.6–1.3)
CREATININE: 1.37 mg/dL — AB (ref 0.61–1.24)
Calcium: 9.3 mg/dL (ref 8.9–10.3)
Chloride: 106 mmol/L (ref 101–111)
GFR calc Af Amer: 51 mL/min — ABNORMAL LOW (ref >60)
GFR calc non Af Amer: 44 mL/min — ABNORMAL LOW (ref >60)
GLUCOSE: 135 mg/dL
Glucose, Bld: 135 mg/dL — ABNORMAL HIGH (ref 65–99)
POTASSIUM: 4.7 mmol/L (ref 3.4–5.3)
Potassium: 4.7 mmol/L (ref 3.5–5.1)
SODIUM: 139 mmol/L (ref 137–147)
SODIUM: 139 mmol/L (ref 135–145)

## 2016-05-18 LAB — CBC WITH DIFFERENTIAL/PLATELET
BASOS PCT: 0 %
Basophils Absolute: 0 10*3/uL (ref 0.0–0.1)
Eosinophils Absolute: 0.2 10*3/uL (ref 0.0–0.7)
Eosinophils Relative: 2 %
HEMATOCRIT: 19.2 % — AB (ref 39.0–52.0)
Hemoglobin: 6.9 g/dL — CL (ref 13.0–17.0)
LYMPHS ABS: 1.9 10*3/uL (ref 0.7–4.0)
Lymphocytes Relative: 19 %
MCH: 37.5 pg — AB (ref 26.0–34.0)
MCHC: 35.9 g/dL (ref 30.0–36.0)
MCV: 104.3 fL — AB (ref 78.0–100.0)
MONO ABS: 0.8 10*3/uL (ref 0.1–1.0)
MONOS PCT: 8 %
Neutro Abs: 7.3 10*3/uL (ref 1.7–7.7)
Neutrophils Relative %: 71 %
PLATELETS: 71 10*3/uL — AB (ref 150–400)
RBC: 1.84 MIL/uL — AB (ref 4.22–5.81)
RDW: 15.7 % — ABNORMAL HIGH (ref 11.5–15.5)
WBC: 10.2 10*3/uL (ref 4.0–10.5)

## 2016-05-18 LAB — CBC AND DIFFERENTIAL
HEMATOCRIT: 19 % — AB (ref 41–53)
HEMOGLOBIN: 6.9 g/dL — AB (ref 13.5–17.5)
Platelets: 71 10*3/uL — AB (ref 150–399)
WBC: 10.2 10^3/mL

## 2016-05-18 LAB — RETICULOCYTES
RBC.: 1.68 MIL/uL — ABNORMAL LOW (ref 4.22–5.81)
Retic Count, Absolute: 8.4 10*3/uL — ABNORMAL LOW (ref 19.0–186.0)
Retic Ct Pct: 0.5 % (ref 0.4–3.1)

## 2016-05-18 LAB — I-STAT TROPONIN, ED: Troponin i, poc: 0.22 ng/mL (ref 0.00–0.08)

## 2016-05-18 LAB — POC OCCULT BLOOD, ED: Fecal Occult Bld: NEGATIVE

## 2016-05-18 LAB — PREPARE RBC (CROSSMATCH)

## 2016-05-18 LAB — ABO/RH: ABO/RH(D): O POS

## 2016-05-18 LAB — CK: Total CK: 205 U/L (ref 49–397)

## 2016-05-18 MED ORDER — CYANOCOBALAMIN 1000 MCG/ML IJ SOLN
1000.0000 ug | Freq: Once | INTRAMUSCULAR | Status: AC
  Administered 2016-05-18: 1000 ug via INTRAMUSCULAR
  Filled 2016-05-18: qty 1

## 2016-05-18 MED ORDER — SODIUM CHLORIDE 0.9 % IV BOLUS (SEPSIS)
1000.0000 mL | Freq: Once | INTRAVENOUS | Status: AC
  Administered 2016-05-18: 1000 mL via INTRAVENOUS

## 2016-05-18 NOTE — ED Provider Notes (Signed)
Pt seen and examined.  Apparently, pt slid to floor in bedroom this morning and could not get up.  Spent the day on the floor.  No strike to head or LOC.  Pt states just  "too weak" to get up.  He reports slowly increasing weakness for weeks.   Hb 6 here tonight.  Trop 0.22.  EKG without change, and pt without CP.  Guaiac Negative.  H/o pernicious anemia, and gets monthly B12 shots and pt "one week late for it" now.    Agree with transfusion, admission, cycle enzymes.  Anemia panel drawn and pending.  Given 1,07200mcg B12 IM in ER.     Rolland PorterMark Kacper Cartlidge, MD 05/18/16 2238

## 2016-05-18 NOTE — ED Notes (Signed)
Writer notified EDP Fayrene FearingJames of abnormal I-stat result.

## 2016-05-18 NOTE — ED Notes (Signed)
[facility] Expected date:  Expected time:  Means of arrival:  Comments: EMS [age] M fall this am, no obvious injuries

## 2016-05-18 NOTE — ED Triage Notes (Signed)
Patient fell at home and was found by son. Patient had been laying in the floor all day. Patient at baseline walks with assistance.

## 2016-05-18 NOTE — ED Provider Notes (Signed)
[facility]-EMERGENCY DEPT Provider Note   CSN: [id number] Arrival date & time: 05/18/16  2011   History   Chief Complaint Chief Complaint  Patient presents with  . Fall    HPI  Julian Taylor is an 82 y.o. male with history of pernicious anemia, sick sinus syndrome s/p pacemaker placement, unsteady gait, BPH, HTN, who presents to the ED for evaluation after a fall. He lives by himself. He states for the past few weeks has progressively felt weaker and low energy. He states this morning he woke up and used the restroom, walked back to his bedroom, sat on the edge of his bed, and then slid down to the floor. He states he did not have the strength to pull himself back up or to get into bed originally. He states he laid on the floor on his back and his side all day until his son came to check on him earlier this evening. Pt states he did not have anything to eat or drink all day. Denies passing out. Denies chest pain or SOB. He states he urinated and defecated on himself as he could not make it to the restroom. His son accompanies him and states Julian Taylor was at his mental baseline with no confusion. He does add that Julian Taylor has been having poor appetite the past few weeks, and poor motivation to go do things with his family. Pt does add he is about one week late on his vitamin B12 shot (gets monthly).  Past Medical History:  Diagnosis Date  . Anemia   . BPH (benign prostatic hyperplasia)    With frequent nocturia  . Essential hypertension, benign   . Hyperlipidemia   . Nocturia    Frequent  . Osteoarthritis   . Osteoarthritis   . Osteoporosis   . Pacemaker 11/28/2013   DR ALLRED  . Pernicious anemia   . Sick sinus syndrome   . Syncope    First happened several years ago and was better when Losartan was stopped. Started taking Losartan and Flomax and had another syncopal episode in Feb 2015.  . Tremor    Long-standing  . Unsteady gait    Uses cane  . Wears hearing aid    Both ears     Patient Active Problem List   Diagnosis Date Noted  . Cardiac pacemaker in situ   . Sick sinus syndrome (HCC) 11/26/2013  . Essential hypertension 11/26/2013    Past Surgical History:  Procedure Laterality Date  . APPENDECTOMY    . CHOLECYSTECTOMY    . INSERT / REPLACE / REMOVE PACEMAKER  11/28/2013   DR Johney FrameALLRED  . PACEMAKER INSERTION  11/28/2013   MDT ADDRL1 pacemaker implanted by Dr Johney FrameAllred for SSS and symptomatic bradycardia  . PERMANENT PACEMAKER INSERTION N/A 11/28/2013   Procedure: PERMANENT PACEMAKER INSERTION;  Surgeon: Gardiner RhymeJames D Allred, MD;  Location: [facility] CATH [facility];  Service: Cardiovascular;  Laterality: N/A;  . TONSILLECTOMY AND ADENOIDECTOMY         Home Medications    Prior to Admission medications   Medication Sig Start Date End Date Taking? Authorizing Provider  acetaminophen (TYLENOL) 500 MG tablet Take 1,000 mg by mouth 2 (two) times daily as needed (pain).     Historical Provider, MD  alfuzosin (UROXATRAL) 10 MG 24 hr tablet Take 1 tablet by mouth daily. 02/24/14   Historical Provider, MD  aspirin EC 81 MG tablet Take 81 mg by mouth daily.    Historical Provider, MD  Calcium Carb-Cholecalciferol (  CALCIUM-VITAMIN D) 600-400 MG-UNIT TABS Take 1 tablet by mouth daily.    Historical Provider, MD  cyanocobalamin (,VITAMIN B-12,) 1000 MCG/ML injection Inject 1,000 mcg into the muscle every 30 (thirty) days. Last injection March 6,2015    Historical Provider, MD  ferrous sulfate 325 (65 FE) MG tablet Take 325 mg by mouth daily with breakfast.    Historical Provider, MD  folic acid (FOLVITE) 400 MCG tablet Take 400 mcg by mouth daily.    Historical Provider, MD  tamsulosin (FLOMAX) 0.4 MG CAPS capsule Take 0.4 mg by mouth daily.  10/07/13   Historical Provider, MD  traMADol Janean Sark) 50 MG tablet Take one tablet by mouth every 8 hours as needed for pain 12/02/13   Kermit Balo, DO    Family History Family History  Problem Relation Age of Onset  . CVA Father     Social  History Social History  Substance Use Topics  . Smoking status: Never Smoker  . Smokeless tobacco: Never Used  . Alcohol use No     Allergies   Review of patient's allergies indicates no known allergies.   Review of Systems Review of Systems 10 Systems reviewed and are negative for acute change except as noted in the HPI.  Physical Exam Updated Vital Signs BP 147/62 (BP Location: Left Arm)   Pulse 94   Temp 98.1 F (36.7 C) (Oral)   Resp 18   SpO2 98%   Physical Exam  Constitutional: He is oriented to person, place, and time.  Elderly, hard of hearing, NAD  HENT:  Head: Atraumatic.  Right Ear: External ear normal.  Left Ear: External ear normal.  Nose: Nose normal.  Mouth/Throat: No oropharyngeal exudate.  MM dry  Eyes: Conjunctivae and EOM are normal. Pupils are equal, round, and reactive to light.  Neck: Normal range of motion. Neck supple.  No c-spine tenderness  Cardiovascular: Normal rate, regular rhythm, normal heart sounds and intact distal pulses.   Pulmonary/Chest: Effort normal and breath sounds normal. No respiratory distress. He has no wheezes.  Abdominal: Soft. Bowel sounds are normal. He exhibits no distension. There is no tenderness. There is no rebound and no guarding.  Genitourinary: Rectal exam shows guaiac negative stool.  Musculoskeletal: He exhibits no edema.  No midline back tenderness  No stepoff or deformity  Lymphadenopathy:    He has no cervical adenopathy.  Neurological: He is alert and oriented to person, place, and time. No cranial nerve deficit.  Skin: Skin is warm and dry. There is pallor.  Psychiatric: He has a normal mood and affect.  Nursing note and vitals reviewed.  Vitals:   05/18/16 2025 05/18/16 2200  BP: 147/62 145/59  Pulse: 94 85  Resp: 18 (!) 27  Temp: 98.1 F (36.7 C)   TempSrc: Oral   SpO2: 98% 100%     ED Treatments / Results  Labs (all labs ordered are listed, but only abnormal results are  displayed) Labs Reviewed  BASIC METABOLIC PANEL - Abnormal; Notable for the following:       Result Value   Glucose, Bld 135 (*)    BUN 28 (*)    Creatinine, Ser 1.37 (*)    GFR calc non Af Amer 44 (*)    GFR calc Af Amer 51 (*)    All other components within normal limits  CBC WITH DIFFERENTIAL/PLATELET - Abnormal; Notable for the following:    RBC 1.84 (*)    Hemoglobin 6.9 (*)    HCT  19.2 (*)    MCV 104.3 (*)    MCH 37.5 (*)    RDW 15.7 (*)    Platelets 71 (*)    All other components within normal limits  RETICULOCYTES - Abnormal; Notable for the following:    RBC. 1.68 (*)    Retic Count, Manual 8.4 (*)    All other components within normal limits  I-STAT TROPOININ, ED - Abnormal; Notable for the following:    Troponin i, poc 0.22 (*)    All other components within normal limits  URINALYSIS, ROUTINE W REFLEX MICROSCOPIC (NOT AT [facility])  CK  VITAMIN B12  FOLATE  IRON AND TIBC  FERRITIN  POC OCCULT BLOOD, ED  POC OCCULT BLOOD, ED  POC OCCULT BLOOD, ED  TYPE AND SCREEN  PREPARE RBC (CROSSMATCH)  ABO/RH    EKG  EKG Interpretation  Date/Time:  Wednesday May 18 2016 21:24:00 EDT Ventricular Rate:  84 PR Interval:    QRS Duration: 110 QT Interval:  383 QTC Calculation: 453 R Axis:   -65 Text Interpretation:  Sinus rhythm Atrial premature complex Short PR interval Left anterior fascicular block Abnormal R-wave progression, early transition Nonspecific T abnrm, anterolateral leads Confirmed by Fayrene Fearing  MD, MARK ([id number]) on 05/18/2016 9:41:02 PM       Radiology Dg Chest 2 View  Result Date: 05/18/2016 CLINICAL DATA:  Shortness of breath, fall from bed. EXAM: CHEST  2 VIEW COMPARISON:  Radiographs of November 11, 2014. FINDINGS: The heart size and mediastinal contours are within normal limits. Both lungs are clear. No pneumothorax or pleural effusion is noted. Left-sided pacemaker is again noted. Atherosclerosis of thoracic aorta is noted. The visualized skeletal  structures are unremarkable. IMPRESSION: Aortic atherosclerosis.  No acute cardiopulmonary abnormality seen. Electronically Signed   By: Lupita Raider, M.D.   On: 05/18/2016 21:44    Procedures Procedures (including critical care time)  Medications Ordered in ED Medications  sodium chloride 0.9 % bolus 1,000 mL (1,000 mLs Intravenous New Bag/Given 05/18/16 2152)  cyanocobalamin ((VITAMIN B-12)) injection 1,000 mcg (1,000 mcg Intramuscular Given 05/18/16 2259)     Initial Impression / Assessment and Plan / ED Course  I have reviewed the triage vital signs and the nursing notes.  Pertinent labs & imaging results that were available during my care of the patient were reviewed by me and considered in my medical decision making (see chart for details).  Clinical Course   NAZEER ROMNEY is an 82 y.o. male with history of pernicious anemia presenting to the ED after laying on the floor all day after he slid down from the edge of his bed earlier this morning. He felt too weak to get up. He states he has felt generally weak for the past few weeks. In the ED he is found to have a hemoglobin of 6.9, baseline in the past closer to 10, 11, or 12. He does look pale on exam. Heme occult negative. No tachycardia and BP normal. Troponin slightly elevated to 0.22 but EKG unchanged from prior. No chest pain or SOB. I discussed findings with pt and his family. Amenable for hospital admission and PRBC transfusion. 2 units have been ordered for transfusion. I spoke with Dr. Ophelia Charter of the hospitalist service who will admit pt to telemetry inpatient. Appreciate assistance.  Final Clinical Impressions(s) / ED Diagnoses   Final diagnoses:  Symptomatic anemia  Generalized weakness  Fall, initial encounter  Elevated troponin  Renal insufficiency    New Prescriptions New Prescriptions  No medications on file     Carlene Coria, Cordelia Poche 05/19/16 0000    Rolland Porter, MD 05/24/16 [phone]

## 2016-05-18 NOTE — H&P (Signed)
History and Physical    Julian Taylor ZOX:[id number] DOB: 1924-10-11 DOA: 05/18/2016  PCP: [name], MD Consultants:  Donnie Aho - cardiology Patient coming from: home - lives alone; sons are concerned that it may be time for SNF placement (would like to be placed at [facility]).  Julian Taylor, [phone] (cell); [phone] (home)  Chief Complaint: fall  HPI: Julian Taylor is a 82 y.o. male with medical history significant of pernicious anemia, sick sinus syndrome s/p pacer in 2015, BPH, and HTN who presents following a fall.  Patient got up about 0830 today to go to the bathroom.  When he returned, he attempted to sit down on the bed but couldn't quite reach the bed and he slid to the floor.  He couldn't get up all day. Son came to check on him after not reaching him by telephone about 1925 and found him in the floor.  Was just too weak to get off the floor.  No myalgias.  No chest pain.  Complains of no pain at all.  Has had progressive weakness over the last 1-2 months, gradual at the beginning but worse recently.  No longer shaving or able to leave home to go out to eat.  Nocturia about q2h and so feels exhausted by the time he gets up in the mornings.  Was at [facility] after he got his pacemaker and had a great experience, one son lives in [place].  Pacemaker was placed in March 2015.  Patient and family would like for him to return there.  He is a veteran from the [place] and should have [place] benefits, but they have not looked into this at all.   ED Course: Per PA-C Sam: Julian Taylor is an 82 y.o. male with history of pernicious anemia presenting to the ED after laying on the floor all day after he slid down from the edge of his bed earlier this morning. He felt too weak to get up. He states he has felt generally weak for the past few weeks. In the ED he is found to have a hemoglobin of 6.9, baseline in the past closer to 10, 11, or 12. He does look pale on exam. Heme  occult negative. No tachycardia and BP normal. Troponin slightly elevated to 0.22 but EKG unchanged from prior. No chest pain or SOB. I discussed findings with pt and his family. Amenable for hospital admission and PRBC transfusion. 2 units have been ordered for transfusion. I spoke with Dr. Ophelia Charter of the hospitalist service who will admit pt to telemetry inpatient. Appreciate assistance.  Review of Systems: As per HPI; otherwise 10 point review of systems reviewed and negative.   Ambulatory Status:  Has a cane and walker but prefers to use the cane  Past Medical History:  Diagnosis Date  . BPH (benign prostatic hyperplasia)    With frequent nocturia  . Essential hypertension, benign    no longer taking medication for this  . Hyperlipidemia   . Nocturia    Frequent  . Osteoarthritis   . Osteoporosis   . Pacemaker 11/28/2013   DR ALLRED  . Pernicious anemia   . Sick sinus syndrome (HCC)   . Syncope    First happened several years ago and was better when Losartan was stopped. Started taking Losartan and Flomax and had another syncopal episode in Feb 2015.  . Tremor    Long-standing  . Unsteady gait    Uses cane  . Wears  hearing aid    Both ears    Past Surgical History:  Procedure Laterality Date  . APPENDECTOMY    . CHOLECYSTECTOMY    . INSERT / REPLACE / REMOVE PACEMAKER  11/28/2013   DR Johney Frame  . PACEMAKER INSERTION  11/28/2013   MDT ADDRL1 pacemaker implanted by Dr Johney Frame for SSS and symptomatic bradycardia  . PERMANENT PACEMAKER INSERTION N/A 11/28/2013   Procedure: PERMANENT PACEMAKER INSERTION;  Surgeon: Gardiner Rhyme, MD;  Location: [facility] CATH LAB;  Service: Cardiovascular;  Laterality: N/A;  . TONSILLECTOMY AND ADENOIDECTOMY      Social History   Social History  . Marital status: Widowed    Spouse name: N/A  . Number of children: N/A  . Years of education: N/A   Occupational History  . retired    Social History Main Topics  . Smoking status: Never Smoker  .  Smokeless tobacco: Never Used  . Alcohol use No  . Drug use: No  . Sexual activity: Not on file   Other Topics Concern  . Not on file   Social History Narrative   Lives alone in [place].  Retired from Systems [employer]    No Known Allergies  Family History  Problem Relation Age of Onset  . CVA Father     Prior to Admission medications   Medication Sig Start Date End Date Taking? Authorizing Provider  aspirin EC 81 MG tablet Take 81 mg by mouth daily.   Yes Historical Provider, MD  Calcium Carb-Cholecalciferol (CALCIUM-VITAMIN D) 600-400 MG-UNIT TABS Take 1 tablet by mouth daily.   Yes Historical Provider, MD  cyanocobalamin (,VITAMIN B-12,) 1000 MCG/ML injection Inject 1,000 mcg into the muscle every 30 (thirty) days.    Yes Historical Provider, MD  finasteride (PROSCAR) 5 MG tablet Take 5 mg by mouth at bedtime. 05/15/16  Yes Historical Provider, MD  folic acid (FOLVITE) 400 MCG tablet Take 400 mcg by mouth daily.   Yes Historical Provider, MD  tamsulosin (FLOMAX) 0.4 MG CAPS capsule Take 0.4 mg by mouth daily with breakfast.  10/07/13  Yes Historical Provider, MD  traMADol (ULTRAM) 50 MG tablet Take one tablet by mouth every 8 hours as needed for pain Patient taking differently: Take 50 mg by mouth at bedtime.  12/02/13  Yes Kermit Balo, DO    Physical Exam: Vitals:   05/19/16 0000 05/19/16 0006 05/19/16 0021 05/19/16 0030  BP: 127/59 132/57 140/63 141/67  Pulse: 85 85 85 93  Resp: 22 22 26 19   Temp:  98.1 F (36.7 C) 98.2 F (36.8 C)   TempSrc:  Oral Oral   SpO2: 100% 100% 99% 98%     General:  Appears calm and comfortable and is NAD Eyes:  PERRL, EOMI, normal lids, iris ENT: extremely hard of hearing despite hearing aids, normal lips & tongue, slightly less than moist mm Neck:  no LAD, masses or thyromegaly Cardiovascular:  RRR, no m/r/g. No LE edema.  Respiratory:  CTA bilaterally, no w/r/r. Normal respiratory effort. Abdomen:  soft, ntnd, NABS Skin:  no rash  or induration seen on limited exam Musculoskeletal:  grossly normal tone BUE/BLE, good ROM, no bony abnormality Psychiatric:  grossly normal mood and affect, speech fluent and appropriate, AOx3 Neurologic:  CN 2-12 grossly intact, moves all extremities in coordinated fashion, sensation intact  Labs on Admission: I have personally reviewed following labs and imaging studies  CBC:  Recent Labs Lab 05/18/16 2102  WBC 10.2  NEUTROABS 7.3  HGB 6.9*  HCT 19.2*  MCV 104.3*  PLT 71*   Basic Metabolic Panel:  Recent Labs Lab 05/18/16 2102  NA 139  K 4.7  CL 106  CO2 23  GLUCOSE 135*  BUN 28*  CREATININE 1.37*  CALCIUM 9.3   GFR: CrCl cannot be calculated (Unknown ideal weight.). Liver Function Tests: No results for input(s): AST, ALT, ALKPHOS, BILITOT, PROT, ALBUMIN in the last 168 hours. No results for input(s): LIPASE, AMYLASE in the last 168 hours. No results for input(s): AMMONIA in the last 168 hours. Coagulation Profile: No results for input(s): INR, PROTIME in the last 168 hours. Cardiac Enzymes:  Recent Labs Lab 05/18/16 2102  CKTOTAL 205   BNP (last 3 results) No results for input(s): PROBNP in the last 8760 hours. HbA1C: No results for input(s): HGBA1C in the last 72 hours. CBG: No results for input(s): GLUCAP in the last 168 hours. Lipid Profile: No results for input(s): CHOL, HDL, LDLCALC, TRIG, CHOLHDL, LDLDIRECT in the last 72 hours. Thyroid Function Tests: No results for input(s): TSH, T4TOTAL, FREET4, T3FREE, THYROIDAB in the last 72 hours. Anemia Panel:  Recent Labs  05/18/16 2249  RETICCTPCT 0.5   Urine analysis:    Component Value Date/Time   COLORURINE YELLOW 05/18/2016 2126   APPEARANCEUR CLEAR 05/18/2016 2126   LABSPEC 1.013 05/18/2016 2126   PHURINE 7.5 05/18/2016 2126   GLUCOSEU NEGATIVE 05/18/2016 2126   HGBUR NEGATIVE 05/18/2016 2126   BILIRUBINUR NEGATIVE 05/18/2016 2126   KETONESUR NEGATIVE 05/18/2016 2126   PROTEINUR  NEGATIVE 05/18/2016 2126   UROBILINOGEN 1.0 11/11/2014 1400   NITRITE NEGATIVE 05/18/2016 2126   LEUKOCYTESUR NEGATIVE 05/18/2016 2126    Creatinine Clearance: CrCl cannot be calculated (Unknown ideal weight.).  Sepsis Labs: @LABRCNTIP (procalcitonin:4,lacticidven:4) )No results found for this or any previous visit (from the past 240 hour(s)).   Radiological Exams on Admission: Dg Chest 2 View  Result Date: 05/18/2016 CLINICAL DATA:  Shortness of breath, fall from bed. EXAM: CHEST  2 VIEW COMPARISON:  Radiographs of November 11, 2014. FINDINGS: The heart size and mediastinal contours are within normal limits. Both lungs are clear. No pneumothorax or pleural effusion is noted. Left-sided pacemaker is again noted. Atherosclerosis of thoracic aorta is noted. The visualized skeletal structures are unremarkable. IMPRESSION: Aortic atherosclerosis.  No acute cardiopulmonary abnormality seen. Electronically Signed   By: Lupita Raider, M.D.   On: 05/18/2016 21:44    EKG: Independently reviewed.  NSR with rate 84; nonspecific ST changes with no evidence of acute ischemia  Assessment/Plan Principal Problem:   Anemia Active Problems:   Cardiac pacemaker in situ   Fall   Weakness generalized   Nocturia associated with benign prostatic hypertrophy   Elevated troponin   Anemia -Patient with h/o pernicious anemia who is 1 week late for his B12 injection -Prior Hgb in 3/16 was 11.7, currently 6.9 -Guaiac negative in ER -While symptoms are quite vague and mild, symptomatic anemia may be contributing to his generalized weakness and falls and so it is reasonable to transfuse -2 units PRBC transfusion initiated in ER -He was also given B12 injection in the ER -Will obtain post-transfusion CBC to determine if additional intervention is needed -Will admit for ongoing monitoring and evaluation  Weakness/Falls -Patient with recent falls and particularly today unable to lift himself onto the bed or off  the floor all day -Currently no evidence of rhabdo - likely related to lack of muscle mass -Will place on falls precautions -He is likely to need long-term placement,  but may qualify for rehab at this time -Will request PT evaluation  Elevated troponin -While creatinine is elevated, this appears to be chronic elevation and so this would not explain troponin leak -No EKG changes concerning for acute ischemia and patient denies any chest pain -Suspect that this is demand related ischemia -Will trend troponin without additional intervention at this time -He sees Dr. Donnie Ahoilley; could consider cardiology consultation if needed -Will repeat EKG in AM for review by day team -Continue ASA  Pacemaker -h/o sick sinus  -Has pacemaker in place  Nocturia with BPH -Continue Proscar and Flomax -This is one of his most concerning symptoms and leads to poor sleep, increasing daytime exhaustion and thus risk for falls -Consider urology consultation  DVT prophylaxis: Lovenox Code Status:  Full - confirmed with patient/family.  Patient's wife died in her early 370s after she laid down next to him.  He regrets not performing CPR on her and so wishes to have everything done. Family Communication:  Sons present at bedside throughout evaluation Disposition Plan: Likely SNF (rehab first if recommended by PT) Consults called: None Admission status: Admit to telemetry.  I anticipate that this patient will require at least 2 midnights in the hospital based on the medical complexity of the problems presented.   Jonah BlueJennifer Gotti Alwin MD [facility]  If 7PM-7AM, please contact night-coverage [email or web address] Password [email or web address]  05/19/2016, 12:42 AM

## 2016-05-19 ENCOUNTER — Encounter (HOSPITAL_COMMUNITY): Payer: Self-pay | Admitting: *Deleted

## 2016-05-19 DIAGNOSIS — R531 Weakness: Secondary | ICD-10-CM

## 2016-05-19 DIAGNOSIS — N183 Chronic kidney disease, stage 3 unspecified: Secondary | ICD-10-CM | POA: Insufficient documentation

## 2016-05-19 DIAGNOSIS — R7989 Other specified abnormal findings of blood chemistry: Secondary | ICD-10-CM

## 2016-05-19 DIAGNOSIS — I248 Other forms of acute ischemic heart disease: Secondary | ICD-10-CM

## 2016-05-19 DIAGNOSIS — W19XXXA Unspecified fall, initial encounter: Secondary | ICD-10-CM | POA: Diagnosis present

## 2016-05-19 DIAGNOSIS — N401 Enlarged prostate with lower urinary tract symptoms: Secondary | ICD-10-CM | POA: Diagnosis present

## 2016-05-19 DIAGNOSIS — R351 Nocturia: Secondary | ICD-10-CM | POA: Diagnosis present

## 2016-05-19 DIAGNOSIS — R778 Other specified abnormalities of plasma proteins: Secondary | ICD-10-CM | POA: Diagnosis present

## 2016-05-19 LAB — BASIC METABOLIC PANEL
ANION GAP: 6 (ref 5–15)
BUN: 27 mg/dL — AB (ref 4–21)
BUN: 27 mg/dL — ABNORMAL HIGH (ref 6–20)
CALCIUM: 8.4 mg/dL — AB (ref 8.9–10.3)
CO2: 23 mmol/L (ref 22–32)
CREATININE: 1.24 mg/dL (ref 0.61–1.24)
Chloride: 108 mmol/L (ref 101–111)
Creatinine: 1.2 mg/dL (ref 0.6–1.3)
GFR, EST AFRICAN AMERICAN: 58 mL/min — AB (ref >60)
GFR, EST NON AFRICAN AMERICAN: 50 mL/min — AB (ref >60)
GLUCOSE: 137 mg/dL — AB (ref 65–99)
Glucose: 137 mg/dL
Potassium: 4.1 mmol/L (ref 3.4–5.3)
Potassium: 4.1 mmol/L (ref 3.5–5.1)
Sodium: 137 mmol/L (ref 135–145)
Sodium: 137 mmol/L (ref 137–147)

## 2016-05-19 LAB — CBC
HCT: 23.3 % — ABNORMAL LOW (ref 39.0–52.0)
HEMOGLOBIN: 8.3 g/dL — AB (ref 13.0–17.0)
MCH: 35 pg — ABNORMAL HIGH (ref 26.0–34.0)
MCHC: 35.6 g/dL (ref 30.0–36.0)
MCV: 98.3 fL (ref 78.0–100.0)
PLATELETS: 55 10*3/uL — AB (ref 150–400)
RBC: 2.37 MIL/uL — AB (ref 4.22–5.81)
RDW: 17.4 % — ABNORMAL HIGH (ref 11.5–15.5)
WBC: 9.8 10*3/uL (ref 4.0–10.5)

## 2016-05-19 LAB — IRON AND TIBC
IRON: 173 ug/dL (ref 45–182)
Saturation Ratios: 91 % — ABNORMAL HIGH (ref 17.9–39.5)
TIBC: 190 ug/dL — ABNORMAL LOW (ref 250–450)
UIBC: 17 ug/dL

## 2016-05-19 LAB — TROPONIN I
TROPONIN I: 0.48 ng/mL — AB (ref <0.03)
Troponin I: 0.6 ng/mL (ref <0.03)
Troponin I: 0.61 ng/mL (ref <0.03)

## 2016-05-19 LAB — FERRITIN: FERRITIN: 595 ng/mL — AB (ref 24–336)

## 2016-05-19 LAB — FOLATE: Folate: 44.1 ng/mL (ref >5.9)

## 2016-05-19 LAB — OCCULT BLOOD X 1 CARD TO LAB, STOOL: Fecal Occult Bld: NEGATIVE

## 2016-05-19 LAB — CBC AND DIFFERENTIAL
HCT: 23 % — AB (ref 41–53)
Hemoglobin: 8.3 g/dL — AB (ref 13.5–17.5)
Platelets: 55 10*3/uL — AB (ref 150–399)
WBC: 9.8 10*3/mL

## 2016-05-19 LAB — SAVE SMEAR

## 2016-05-19 LAB — VITAMIN B12: Vitamin B-12: >7500 pg/mL — ABNORMAL HIGH (ref 180–914)

## 2016-05-19 MED ORDER — ENSURE ENLIVE PO LIQD
237.0000 mL | Freq: Two times a day (BID) | ORAL | Status: DC
  Administered 2016-05-19 – 2016-05-20 (×4): 237 mL via ORAL

## 2016-05-19 MED ORDER — ONDANSETRON HCL 4 MG PO TABS: 4.0000 mg | ORAL_TABLET | Freq: Four times a day (QID) | ORAL | Status: DC | PRN

## 2016-05-19 MED ORDER — ACETAMINOPHEN 650 MG RE SUPP: 650.0000 mg | Freq: Four times a day (QID) | RECTAL | Status: DC | PRN

## 2016-05-19 MED ORDER — FINASTERIDE 5 MG PO TABS
5.0000 mg | ORAL_TABLET | Freq: Every day | ORAL | Status: DC
  Administered 2016-05-19 (×2): 5 mg via ORAL
  Filled 2016-05-19 (×2): qty 1

## 2016-05-19 MED ORDER — FOLIC ACID 1 MG PO TABS
1000.0000 ug | ORAL_TABLET | Freq: Every day | ORAL | Status: DC
  Administered 2016-05-19 – 2016-05-20 (×2): 1 mg via ORAL
  Filled 2016-05-19 (×2): qty 1

## 2016-05-19 MED ORDER — DOCUSATE SODIUM 100 MG PO CAPS
100.0000 mg | ORAL_CAPSULE | Freq: Two times a day (BID) | ORAL | Status: DC
  Administered 2016-05-19 – 2016-05-20 (×3): 100 mg via ORAL
  Filled 2016-05-19 (×4): qty 1

## 2016-05-19 MED ORDER — ACETAMINOPHEN 325 MG PO TABS: 650.0000 mg | ORAL_TABLET | Freq: Four times a day (QID) | ORAL | Status: DC | PRN

## 2016-05-19 MED ORDER — ENOXAPARIN SODIUM 40 MG/0.4ML ~~LOC~~ SOLN
40.0000 mg | SUBCUTANEOUS | Status: DC
  Administered 2016-05-19: 40 mg via SUBCUTANEOUS
  Filled 2016-05-19: qty 0.4

## 2016-05-19 MED ORDER — ONDANSETRON HCL 4 MG/2ML IJ SOLN: 4.0000 mg | Freq: Four times a day (QID) | INTRAMUSCULAR | Status: DC | PRN

## 2016-05-19 MED ORDER — SODIUM CHLORIDE 0.9% FLUSH
3.0000 mL | Freq: Two times a day (BID) | INTRAVENOUS | Status: DC
  Administered 2016-05-19 – 2016-05-20 (×3): 3 mL via INTRAVENOUS

## 2016-05-19 MED ORDER — SODIUM CHLORIDE 0.9 % IV SOLN
Freq: Once | INTRAVENOUS | Status: AC
  Administered 2016-05-19: 02:00:00 via INTRAVENOUS

## 2016-05-19 MED ORDER — TRAMADOL HCL 50 MG PO TABS
50.0000 mg | ORAL_TABLET | Freq: Every day | ORAL | Status: DC
  Administered 2016-05-19 (×2): 50 mg via ORAL
  Filled 2016-05-19 (×2): qty 1

## 2016-05-19 MED ORDER — TAMSULOSIN HCL 0.4 MG PO CAPS
0.4000 mg | ORAL_CAPSULE | Freq: Every day | ORAL | Status: DC
  Administered 2016-05-19 – 2016-05-20 (×2): 0.4 mg via ORAL
  Filled 2016-05-19 (×2): qty 1

## 2016-05-19 MED ORDER — ASPIRIN EC 81 MG PO TBEC
81.0000 mg | DELAYED_RELEASE_TABLET | Freq: Every day | ORAL | Status: DC
  Administered 2016-05-19 – 2016-05-20 (×2): 81 mg via ORAL
  Filled 2016-05-19 (×2): qty 1

## 2016-05-19 MED ORDER — ORAL CARE MOUTH RINSE
15.0000 mL | Freq: Two times a day (BID) | OROMUCOSAL | Status: DC
  Administered 2016-05-19 – 2016-05-20 (×3): 15 mL via OROMUCOSAL

## 2016-05-19 NOTE — Progress Notes (Signed)
Initial Nutrition Assessment  DOCUMENTATION CODES:   Not applicable  INTERVENTION:  Ensure Enlive po BID, each supplement provides 350 kcal and 20 grams of protein Magic cup q24h with Lunch, each supplement provides 290 kcal and 9 grams of protein  NUTRITION DIAGNOSIS:   Inadequate oral intake related to poor appetite as evidenced by per patient/family report.  GOAL:   Patient will meet greater than or equal to 90% of their needs  MONITOR:   PO intake, Supplement acceptance, I & O's, Labs, Weight trends  REASON FOR ASSESSMENT:   Malnutrition Screening Tool    ASSESSMENT:    [place] is a 82 y.o. male with medical history significant of pernicious anemia, sick sinus syndrome s/p pacer in 2015, BPH, and HTN who presents following a fall  Spoke with [name], Son at bedside. Patient is hard of hearing and so most information was gotten from son. He endorses pt losing weight but is unsure of an amount. Per chart review pt was 155# 02/2014, is now 149# insignificant wt loss. Endorses ok PO intake at home, though pt was "lying on the floor all day yesterday" per patient, and thus didn't eat much during that time. He also endorses progressive weakness over the last few months that have resulted in patient eating frozen dinners, and meals on wheels mostly. He had Eggs, Tomasa BlaseBacon, and Potatoes this morning he consumed about 75% of per son. They would like to try any nutrition interventions possible. Nutrition-Focused physical exam completed. Findings are moderate fat depletion, mild muscle depletion, and no edema.  Labs and medications reviewed: Colace, Folic Acid  Diet Order:  Diet regular Room service appropriate? Yes; Fluid consistency: Thin  Skin:  Wound (see comment) (eczema)  Last BM:  9/14  Height:   Ht Readings from Last 1 Encounters:  05/19/16 6' (1.829 m)    Weight:   Wt Readings from Last 1 Encounters:  05/19/16 149 lb 11.1 oz (67.9 kg)    Ideal Body  Weight:  80.9 kg  BMI:  Body mass index is 20.3 kg/m.  Estimated Nutritional Needs:   Kcal:  1500-1900 calories  Protein:  68-82 gm  Fluid:  >/= 1.5L  EDUCATION NEEDS:   No education needs identified at this time  Dionne AnoWilliam M. Emma Schupp, MS, RD LDN Inpatient Clinical Dietitian Pager [phone]

## 2016-05-19 NOTE — Progress Notes (Addendum)
PROGRESS NOTE    Julian Taylor  GEX:[id number] DOB: 1925-01-08 DOA: 05/18/2016 PCP: [name], MD   Chief Complaint  Patient presents with  . Fall    Brief Narrative:  82 year old history of anemia, sick sinus syndrome, BPH, hypertension who presented to emergency department following a fall. Patient admitted for symptomatically anemia, found to have hemoglobin of 6.9, given blood transfusion. PT consulted for weakness and falls. Will likely need SNF. Assessment & Plan   Symptomatic anemia -Patient has a history of pernicious anemia, he is approximately one week late and receiving his B-12 injection -Hemoglobin in March 2016 was approximately 11, upon admission yesterday, patient's hemoglobin 6.9 -FOBT negative -Patient given 2 units PRBC, hemoglobin currently 8.3 -Patient did have weakness and fell yesterday and was unable to get up. -Pending anemia panel  Elevated troponin -Likely demand ischemia from anemia -Currently 0.60, will continue to trend -patient denies chest pain, EKG shows no acute ischemia or changes -Patient does follow with Dr. Donnie Aho -Will consult cardiology  Thrombocytopenia -Platelets back in March 2016 226, currently 55 -continue to monitor CBC -Will discontinue lovenox, place on SCDs -Will order peripheral smear -Have placed call to PCP to obtain past platelet count- platelets in March 2017 were 193  Fall/Generalized weakness -Possibly secondary to anemia -PT consulted  BPH -Continue Flomax and pro scar -Patient may want to follow up with urology as an outpatient  History of sick sinus syndrome -Status post pacemaker  DVT Prophylaxis  SCDs  Code Status: Full  Family Communication: None  Disposition Plan: Admitted. He to monitor troponin, pending PT evaluation.  Consultants Cardiology  Procedures  None  Antibiotics   Anti-infectives    None      Subjective:   Julian Taylor seen and examined today.  Patient denies chest  pain, shortness of breath, abdominal pain, nausea, vomiting, diarrhea, constipation, dizziness or headache. Does complain of feeling weak.    Objective:   Vitals:   05/19/16 0125 05/19/16 0200 05/19/16 0410 05/19/16 0800  BP: 135/67 (!) 137/51 (!) 116/40 112/79  Pulse: 88 84 77 71  Resp: 20 20 18 16   Temp: 98 F (36.7 C) 98 F (36.7 C) 98.6 F (37 C) 98.2 F (36.8 C)  TempSrc: Oral Oral Oral Oral  SpO2: 100% 100% 100% 98%  Weight:      Height:        Intake/Output Summary (Last 24 hours) at 05/19/16 1234 Last data filed at 05/19/16 0800  Gross per 24 hour  Intake           980.83 ml  Output              475 ml  Net           505.83 ml   Filed Weights   05/19/16 0114  Weight: 67.9 kg (149 lb 11.1 oz)    Exam  General: Well developed, well nourished, NAD, appears stated age  HEENT: NCAT, mucous membranes moist.   Cardiovascular: S1 S2 auscultated, RRR  Respiratory: Clear to auscultation bilaterally with equal chest rise  Abdomen: Soft, nontender, nondistended, + bowel sounds  Extremities: warm dry without cyanosis clubbing or edema  Neuro: AAOx3, hard of hearing, uses hearing aids, otherwise nonfocal  Psych: Normal affect and demeanor with intact judgement and insight   Data Reviewed: I have personally reviewed following labs and imaging studies  CBC:  Recent Labs Lab 05/18/16 2102 05/19/16 0508  WBC 10.2 9.8  NEUTROABS 7.3  --   HGB 6.9*  8.3*  HCT 19.2* 23.3*  MCV 104.3* 98.3  PLT 71* 55*   Basic Metabolic Panel:  Recent Labs Lab 05/18/16 2102 05/19/16 0508  NA 139 137  K 4.7 4.1  CL 106 108  CO2 23 23  GLUCOSE 135* 137*  BUN 28* 27*  CREATININE 1.37* 1.24  CALCIUM 9.3 8.4*   GFR: Estimated Creatinine Clearance: 38.8 mL/min (by C-G formula based on SCr of 1.24 mg/dL). Liver Function Tests: No results for input(s): AST, ALT, ALKPHOS, BILITOT, PROT, ALBUMIN in the last 168 hours. No results for input(s): LIPASE, AMYLASE in the last 168  hours. No results for input(s): AMMONIA in the last 168 hours. Coagulation Profile: No results for input(s): INR, PROTIME in the last 168 hours. Cardiac Enzymes:  Recent Labs Lab 05/18/16 2102 05/19/16 0140 05/19/16 0731  CKTOTAL 205  --   --   TROPONINI  --  0.48* 0.60*   BNP (last 3 results) No results for input(s): PROBNP in the last 8760 hours. HbA1C: No results for input(s): HGBA1C in the last 72 hours. CBG: No results for input(s): GLUCAP in the last 168 hours. Lipid Profile: No results for input(s): CHOL, HDL, LDLCALC, TRIG, CHOLHDL, LDLDIRECT in the last 72 hours. Thyroid Function Tests: No results for input(s): TSH, T4TOTAL, FREET4, T3FREE, THYROIDAB in the last 72 hours. Anemia Panel:  Recent Labs  05/18/16 2249  FOLATE 44.1  RETICCTPCT 0.5   Urine analysis:    Component Value Date/Time   COLORURINE YELLOW 05/18/2016 2126   APPEARANCEUR CLEAR 05/18/2016 2126   LABSPEC 1.013 05/18/2016 2126   PHURINE 7.5 05/18/2016 2126   GLUCOSEU NEGATIVE 05/18/2016 2126   HGBUR NEGATIVE 05/18/2016 2126   BILIRUBINUR NEGATIVE 05/18/2016 2126   KETONESUR NEGATIVE 05/18/2016 2126   PROTEINUR NEGATIVE 05/18/2016 2126   UROBILINOGEN 1.0 11/11/2014 1400   NITRITE NEGATIVE 05/18/2016 2126   LEUKOCYTESUR NEGATIVE 05/18/2016 2126   Sepsis Labs: @LABRCNTIP (procalcitonin:4,lacticidven:4)  )No results found for this or any previous visit (from the past 240 hour(s)).    Radiology Studies: Dg Chest 2 View  Result Date: 05/18/2016 CLINICAL DATA:  Shortness of breath, fall from bed. EXAM: CHEST  2 VIEW COMPARISON:  Radiographs of November 11, 2014. FINDINGS: The heart size and mediastinal contours are within normal limits. Both lungs are clear. No pneumothorax or pleural effusion is noted. Left-sided pacemaker is again noted. Atherosclerosis of thoracic aorta is noted. The visualized skeletal structures are unremarkable. IMPRESSION: Aortic atherosclerosis.  No acute cardiopulmonary  abnormality seen. Electronically Signed   By: Lupita RaiderJames  Green Jr, M.D.   On: 05/18/2016 21:44     Scheduled Meds: . aspirin EC  81 mg Oral Daily  . docusate sodium  100 mg Oral BID  . enoxaparin (LOVENOX) injection  40 mg Subcutaneous Q24H  . feeding supplement (ENSURE ENLIVE)  237 mL Oral BID BM  . finasteride  5 mg Oral QHS  . folic acid  1,000 mcg Oral Daily  . mouth rinse  15 mL Mouth Rinse BID  . sodium chloride flush  3 mL Intravenous Q12H  . tamsulosin  0.4 mg Oral Q breakfast  . traMADol  50 mg Oral QHS   Continuous Infusions:    LOS: 1 day   Time Spent in minutes   30 minutes  Lillionna Nabi D.O. on 05/19/2016 at 12:34 PM  Between 7am to 7pm - Pager - [phone]  After 7pm go to [email or web address] - password [email or web address]  And look for the night coverage person covering for me after  hours  [employer]  [phone]

## 2016-05-19 NOTE — Care Management Note (Signed)
Case Management Note  Patient Details  Name: Julian Taylor MRN: [id number] Date of Birth: 04-03-1925  Subjective/Objective:82 y/o m admitted w/Anemia. From home.Pt cons-await recc.                    Action/Plan:d/c plan home.   Expected Discharge Date:                  Expected Discharge Plan:  Home/Self Care  In-House Referral:     Discharge planning Services  CM Consult  Post Acute Care Choice:    Choice offered to:     DME Arranged:    DME Agency:     HH Arranged:    HH Agency:     Status of Service:  In process, will continue to follow  If discussed at Long Length of Stay Meetings, dates discussed:    Additional Comments:  Julian Taylor, Julian Mcferran, RN 05/19/2016, 1:13 PM

## 2016-05-19 NOTE — Progress Notes (Signed)
Taking of care of patient at this time, agree with previous RN assessment. Patient denies any needs at this time. Will continue to monitor.

## 2016-05-19 NOTE — Plan of Care (Signed)
Problem: Education: Goal: Knowledge of [facility] Education information/materials will improve Outcome: Progressing Patient is very HOH  Problem: Safety: Goal: Ability to remain free from injury will improve Outcome: Progressing Patient is a high fall risk, bed alarm on  Problem: Skin Integrity: Goal: Risk for impaired skin integrity will decrease Outcome: Progressing See skin assessment

## 2016-05-19 NOTE — Consult Note (Signed)
CARDIOLOGY CONSULT NOTE   Patient ID: Julian Taylor MRN: [id number] DOB/AGE: 1925/07/08 82 y.o.  Admit date: 05/18/2016  Primary Physician   [name], MD Primary Cardiologist   Dr Donnie Aho, Dr Johney Frame Reason for Consultation   Elevated troponin Requesting MD: Dr Catha Gosselin  [id number] EDUAR Julian Taylor is a 82 y.o. year old male with a history of pernicious anemia, SSS s/p MDT pacer in 2015, BPH, and HTN.   Pt admitted with a fall, found by his son, H&H were 6.9/19.2 on admission, troponin was elevated, cards asked to see.   [name] is not very active, because of balance issues and also is a little weak. The weakness has been going on, gradually worsening for several months.. He has had trouble with ADLs and housework because of the weakness.   He was coming back from the bathroom, was trying to get back into bed but it was a little high for him. He ended up in the floor and was too weak to get up.   It is not clear what his activity level was before he got weak, but he was able to walk to the mailbox without getting chest pain.  He occasionally gets chest pain. The episodes are fleeting and not associated exertion. He gets a little dizzy at times. Has not passed out since the PPM. He denies SOB or DOE. No LE edema. Occasional tingling in his legs, big toenail bothers him at times.  Main problem is weakness.    Past Medical History:  Diagnosis Date  . BPH (benign prostatic hyperplasia)    With frequent nocturia  . Essential hypertension, benign    no longer taking medication for this  . HOH (hard of hearing)    Bilateral hearing aids  . Hyperlipidemia   . Nocturia    Frequent  . Osteoarthritis   . Osteoporosis   . Pacemaker 11/28/2013   DR ALLRED  . Pernicious anemia   . Sick sinus syndrome (HCC)   . Syncope    First happened several years ago and was better when Losartan was stopped. Started taking Losartan and Flomax and had another syncopal episode in Feb 2015.  .  Tremor    Long-standing  . Unsteady gait    Uses cane  . Wears hearing aid    Both ears     Past Surgical History:  Procedure Laterality Date  . APPENDECTOMY    . CHOLECYSTECTOMY    . INSERT / REPLACE / REMOVE PACEMAKER  11/28/2013   DR Johney Frame  . PACEMAKER INSERTION  11/28/2013   MDT ADDRL1 pacemaker implanted by Dr Johney Frame for SSS and symptomatic bradycardia  . PERMANENT PACEMAKER INSERTION N/A 11/28/2013   Procedure: PERMANENT PACEMAKER INSERTION;  Surgeon: Gardiner Rhyme, MD;  Location: [facility] CATH LAB;  Service: Cardiovascular;  Laterality: N/A;  . TONSILLECTOMY AND ADENOIDECTOMY      No Known Allergies  I have reviewed the patient's current medications . aspirin EC  81 mg Oral Daily  . docusate sodium  100 mg Oral BID  . feeding supplement (ENSURE ENLIVE)  237 mL Oral BID BM  . finasteride  5 mg Oral QHS  . folic acid  1,000 mcg Oral Daily  . mouth rinse  15 mL Mouth Rinse BID  . sodium chloride flush  3 mL Intravenous Q12H  . tamsulosin  0.4 mg Oral Q breakfast  . traMADol  50 mg Oral QHS     acetaminophen **OR** acetaminophen, ondansetron **OR** ondansetron (ZOFRAN)  IV  Prior to Admission medications   Medication Sig Start Date End Date Taking? Authorizing Provider  aspirin EC 81 MG tablet Take 81 mg by mouth daily.   Yes Historical Provider, MD  Calcium Carb-Cholecalciferol (CALCIUM-VITAMIN D) 600-400 MG-UNIT TABS Take 1 tablet by mouth daily.   Yes Historical Provider, MD  cyanocobalamin (,VITAMIN B-12,) 1000 MCG/ML injection Inject 1,000 mcg into the muscle every 30 (thirty) days.    Yes Historical Provider, MD  finasteride (PROSCAR) 5 MG tablet Take 5 mg by mouth at bedtime. 05/15/16  Yes Historical Provider, MD  folic acid (FOLVITE) 400 MCG tablet Take 400 mcg by mouth daily.   Yes Historical Provider, MD  tamsulosin (FLOMAX) 0.4 MG CAPS capsule Take 0.4 mg by mouth daily with breakfast.  10/07/13  Yes Historical Provider, MD  traMADol (ULTRAM) 50 MG tablet Take one  tablet by mouth every 8 hours as needed for pain Patient taking differently: Take 50 mg by mouth at bedtime.  12/02/13  Yes Kermit Balo, DO     Social History   Social History  . Marital status: Widowed    Spouse name: N/A  . Number of children: N/A  . Years of education: N/A   Occupational History  . retired    Social History Main Topics  . Smoking status: Never Smoker  . Smokeless tobacco: Never Used  . Alcohol use No  . Drug use: No  . Sexual activity: Not on file   Other Topics Concern  . Not on file   Social History Narrative   Lives alone in [place].  Retired from Systems developer    Family Status  Relation Status  . Mother Deceased  . Father Deceased  . Sister Alive   A+W  . Sister Alive   A+W   Family History  Problem Relation Age of Onset  . CVA Father      ROS:  Full 14 point review of systems complete and found to be negative unless listed above.  Physical Exam: Blood pressure (!) 121/52, pulse 69, temperature 97.8 F (36.6 C), temperature source Oral, resp. rate 18, height 6' (1.829 m), weight 149 lb 11.1 oz (67.9 kg), SpO2 97 %.  General: Well developed, well nourished, male in no acute distress Head: Eyes PERRLA, No xanthomas.   Normocephalic and atraumatic, oropharynx without edema or exudate. Dentition: poor Lungs: clear bilaterally Heart: HRRR S1 S2, no rub/gallop,no murmur. pulses are 2+ all 4 extrem.   Neck: No carotid bruits. No lymphadenopathy.  JVD not elevated Abdomen: Bowel sounds present, abdomen soft and non-tender without masses or hernias noted. Msk:  No spine or cva tenderness. No weakness, no joint deformities or effusions. Extremities: No clubbing or cyanosis. No edema.  Neuro: Alert and oriented X 3. No focal deficits noted. Psych:  Good affect, responds appropriately Skin: No rashes noted. Abrasion R knee  Labs:   Lab Results  Component Value Date   WBC 9.8 05/19/2016   HGB 8.3 (L) 05/19/2016   HCT 23.3 (L)  05/19/2016   MCV 98.3 05/19/2016   PLT 55 (L) 05/19/2016    Recent Labs Lab 05/19/16 0508  NA 137  K 4.1  CL 108  CO2 23  BUN 27*  CREATININE 1.24  CALCIUM 8.4*  GLUCOSE 137*   Magnesium  Date Value Ref Range Status  08/09/2009 2.4 1.5 - 2.5 mg/dL Final    Recent Labs  16/10/96 2102 05/19/16 0140 05/19/16 0731 05/19/16 1308  CKTOTAL 205  --   --   --  TROPONINI  --  0.48* 0.60* 0.61*    Recent Labs  05/18/16 2115  TROPIPOC 0.22*   Vitamin B-12  Date/Time Value Ref Range Status  05/19/2016 10:47 AM >7,500 (H) 180 - 914 pg/mL Final    Comment:    RESULT CONFIRMED BY AUTOMATED DILUTION (NOTE) This assay is not validated for testing neonatal or myeloproliferative syndrome specimens for Vitamin B12 levels. Performed at [facility]    Folate  Date/Time Value Ref Range Status  05/18/2016 10:49 PM 44.1 >5.9 ng/mL Final    Comment:    RESULTS CONFIRMED BY MANUAL DILUTION Performed at [facility]    Ferritin  Date/Time Value Ref Range Status  05/19/2016 10:47 AM 595 (H) 24 - 336 ng/mL Final    Comment:    Performed at [facility]   TIBC  Date/Time Value Ref Range Status  05/19/2016 10:47 AM 190 (L) 250 - 450 ug/dL Final   Iron  Date/Time Value Ref Range Status  05/19/2016 10:47 AM 173 45 - 182 ug/dL Final   Retic Ct Pct  Date/Time Value Ref Range Status  05/18/2016 10:49 PM 0.5 0.4 - 3.1 % Final    Echo: 11/2013 EF 60-65%, no WMA RV normal, no sig valvular abnormalities  ECG:  09/14 SR, no acute ischemic changes Prev ECG w/ inc RBBB On telemetry, intermittent A pacing  Radiology:  Dg Chest 2 View Result Date: 05/18/2016 CLINICAL DATA:  Shortness of breath, fall from bed. EXAM: CHEST  2 VIEW COMPARISON:  Radiographs of November 11, 2014. FINDINGS: The heart size and mediastinal contours are within normal limits. Both lungs are clear. No pneumothorax or pleural effusion is noted. Left-sided pacemaker is again noted.  Atherosclerosis of thoracic aorta is noted. The visualized skeletal structures are unremarkable. IMPRESSION: Aortic atherosclerosis.  No acute cardiopulmonary abnormality seen. Electronically Signed   By: Lupita Raider, M.D.   On: 05/18/2016 21:44    ASSESSMENT AND PLAN:   The patient was seen today by Dr Delton See, the patient evaluated and the data reviewed.  Principal Problem:   Anemia - per IM  Active Problems:   Cardiac pacemaker in situ - we have not checked device since 2015. - not currently able to get info from Dr York Spaniel office  - seems to be working well, follow on telemetry    Fall   Weakness generalized - per IM    Nocturia associated with benign prostatic hypertrophy - per IM    Elevated troponin - mild elevation and trending down. - will ck echo - if EF nl and no WMA, MD advise on further eval.  Signed: Leanna Battles 05/19/2016 4:41 PM Beeper [phone]  Co-Sign MD   The patient was seen, examined and discussed with Theodore Demark, PA-C and I agree with the above.   82 y.o. year old male with a history of pernicious anemia, SSS s/p MDT pacer in 2015, BPH, and HTN who was admitted after a fall, witnessed by his son, on admission Hb 6.9, troponin was elevated 0.48--> 0.6 --> 0.6.  Mr Lipe is not very active, because of balance issues and also is a little weak. The weakness has been going on, gradually worsening for several months.. He has had trouble with ADLs and housework because of the weakness.  He felt more weak than usual after walking to the bathroom and let himself slide to the ground, no loss of consciousness or head injury. He has obtained 2 units of PRBC, feeling better.  We will obtain echocardiogram. Interrogate his PM for possible episodes of brady or tachyarrhythmias.  Physical exam is not suggestive of significant valvular abnormality or CHF. He appears dry.  I would follow echo, troponin is most probably demand ischemia in the settings  of severe anemia. We will treat conservatively, he is on aspirin, I would avoid betablockers with hypotension and weakness, and dizziness.   Tobias AlexanderKatarina Dwon Sky 05/19/2016

## 2016-05-19 NOTE — Clinical Social Work Note (Signed)
Clinical Social Work Assessment  Patient Details  Name: Julian Taylor MRN: [id number] Date of Birth: December 09, 1924  Date of referral:  05/19/16               Reason for consult:  Facility Placement                Permission sought to share information with:  [facility]cean[facility] Contact Representative Permission granted to share information::  Yes, Verbal Permission Granted  Name::        Agency::     Relationship::     Contact Information:     Housing/Transportation Living arrangements for the past 2 months:  Single Family Home Source of Information:  Patient, Adult Children Patient Interpreter Needed:  None Criminal Activity/Legal Involvement Pertinent to Current Situation/Hospitalization:  No - Comment as needed Significant Relationships:  Adult Children Lives with:  Self Do you feel safe going back to the place where you live?  No Need for family participation in patient care:  Yes (Comment)  Care giving concerns:  CSW received consult from PT, Thomasene MohairKati that they are recommending SNF for patient at discharge.    Social Worker assessment / plan:  CSW spoke with patient & son, Julian Taylor at bedside re: discharge planning. Patient & son are agreeable with plan, son states that they have been thinking about moving him into a nursing facility or assisted living so this will help with that transition.   Employment status:  Retired Database administratornsurance information:  Managed Medicare PT Recommendations:  Skilled Nursing Facility Information / Referral to community resources:  Skilled Nursing Facility  Patient/Family's Response to care:  Son states that patient had been to [facility] back in 2015 and had a good experience, would prefer to return there. CSW confirmed with Julian Taylor at [facility] that they would be able to take patient when ready & that they have long term beds available as well if needed.   Patient/Family's Understanding of and Emotional Response to Diagnosis, Current Treatment, and Prognosis:   Patient's son had questions regarding discharge, CSW explained best to ability & had son speak with Dr. Catha Taylor. Anticipating possible discharge tomorrow, Fri 9/15.   Emotional Assessment Appearance:  Appears stated age Attitude/Demeanor/Rapport:    Affect (typically observed):    Orientation:  Oriented to Self, Oriented to Place, Oriented to  Time, Oriented to Situation Alcohol / Substance use:    Psych involvement (Current and /or in the community):     Discharge Needs  Concerns to be addressed:    Readmission within the last 30 days:    Current discharge risk:    Barriers to Discharge:      Julian Taylor, Julian Malcolm F, LCSW 05/19/2016, 2:58 PM

## 2016-05-19 NOTE — Evaluation (Signed)
Physical Therapy Evaluation Patient Details Name: Julian Taylor MRN: [id number] DOB: [birth year] Today's Date: 05/19/2016   History of Present Illness  82 year old history of anemia, sick sinus syndrome, s/p pacemaker, BPH, hypertension who presented to emergency department following a fall. Patient admitted for symptomatic anemia, found to have hemoglobin of 6.9, received blood transfusion  Clinical Impression  Pt admitted with above diagnosis. Pt currently with functional limitations due to the deficits listed below (see PT Problem List).  Pt will benefit from skilled PT to increase their independence and safety with mobility to allow discharge to the venue listed below.  Pt performed gait training with RW, distance to tolerance.  Pt lives alone and would benefit from SNF upon d/c.     Follow Up Recommendations SNF;Supervision for mobility/OOB    Equipment Recommendations  None recommended by PT    Recommendations for Other Services       Precautions / Restrictions Precautions Precautions: Fall Precaution Comments: very HOH even with hearing aides      Mobility  Bed Mobility Overal bed mobility: Needs Assistance Bed Mobility: Supine to Sit     Supine to sit: Min assist;HOB elevated     General bed mobility comments: slight assist for trunk upright  Transfers Overall transfer level: Needs assistance Equipment used: Rolling walker (2 wheeled) Transfers: Sit to/from Stand Sit to Stand: Min guard         General transfer comment: visual cues for hand placement  Ambulation/Gait Ambulation/Gait assistance: Min guard Ambulation Distance (Feet): 90 Feet Assistive device: Rolling walker (2 wheeled) Gait Pattern/deviations: Step-through pattern;Trunk flexed;Decreased stride length     General Gait Details: verbal cues for safe use of RW, pt has not been using RW at home, utilized today due to fall risk, weakness  Stairs            Wheelchair Mobility     Modified Rankin (Stroke Patients Only)       Balance Overall balance assessment: History of Falls (son reports less falls since pt received PPM)                                           Pertinent Vitals/Pain Pain Assessment: No/denies pain    Home Living Family/patient expects to be discharged to:: Skilled nursing facility Living Arrangements: Alone   Type of Home: Apartment Home Access: Stairs to enter     Home Layout: One level Home Equipment: Environmental consultantWalker - 2 wheels;Cane - single point      Prior Function Level of Independence: Needs assistance   Gait / Transfers Assistance Needed: modified independent, occasionally uses cane but states he doesn't like to use assistive device  ADL's / Homemaking Assistance Needed: family assists with errands, son reports pt used to go out to eat with him 3x a week and has been declining to do this last several weeks        Hand Dominance        Extremity/Trunk Assessment               Lower Extremity Assessment: Generalized weakness         Communication   Communication: HOH  Cognition Arousal/Alertness: Awake/alert Behavior During Therapy: WFL for tasks assessed/performed Overall Cognitive Status: Within Functional Limits for tasks assessed  General Comments      Exercises        Assessment/Plan    PT Assessment Patient needs continued PT services  PT Diagnosis Difficulty walking   PT Problem List Decreased strength;Decreased balance;Decreased activity tolerance;Decreased knowledge of use of DME;Decreased mobility  PT Treatment Interventions DME instruction;Gait training;Functional mobility training;Balance training;Therapeutic exercise;Therapeutic activities;Patient/family education   PT Goals (Current goals can be found in the Care Plan section) Acute Rehab PT Goals PT Goal Formulation: With patient/family Time For Goal Achievement: 05/26/16 Potential to  Achieve Goals: Good    Frequency Min 3X/week   Barriers to discharge        Co-evaluation               End of Session Equipment Utilized During Treatment: Gait belt Activity Tolerance: Patient limited by fatigue Patient left: in chair;with call bell/phone within reach;with family/visitor present;with chair alarm set Nurse Communication: Mobility status         Time: 1426-1440 PT Time Calculation (min) (ACUTE ONLY): 14 min   Charges:   PT Evaluation $PT Eval Low Complexity: 1 Procedure     PT G Codes:        Julian Taylor,Julian Taylor 05/19/2016, 3:49 PM Julian Taylor, PT, DPT 05/19/2016 Pager: [phone]

## 2016-05-19 NOTE — NC FL2 (Signed)
[place] MEDICAID FL2 LEVEL OF CARE SCREENING TOOL     IDENTIFICATION  Patient Name: Julian Taylor Birthdate: 1924-12-28 Sex: male Admission Date (Current Location): 05/18/2016  [facility] [place]   Facility and Address:  [facility],  [address], [place] [id number]      Provider Number: [id number]  Attending Physician Name and Address:  Edsel PetrinMaryann Mikhail, DO  Relative Name and Phone Number:       Current Level of Care: Hospital Recommended Level of Care: Skilled Nursing Facility Prior Approval Number:    Date Approved/Denied:   PASRR Number: [id number]  Discharge Plan: SNF    Current Diagnoses: Patient Active Problem List   Diagnosis Date Noted  . Fall 05/19/2016  . Weakness generalized 05/19/2016  . Nocturia associated with benign prostatic hypertrophy 05/19/2016  . Elevated troponin 05/19/2016  . Chronic kidney disease (CKD), stage III (moderate) 05/19/2016  . Anemia 05/18/2016  . Cardiac pacemaker in situ   . Sick sinus syndrome (HCC) 11/26/2013  . Essential hypertension 11/26/2013    Orientation RESPIRATION BLADDER Height & Weight     Self, Time, Situation, Place  Normal Continent Weight: 149 lb 11.1 oz (67.9 kg) Height:  6' (182.9 cm)  BEHAVIORAL SYMPTOMS/MOOD NEUROLOGICAL BOWEL NUTRITION STATUS      Continent Diet (Regular)  AMBULATORY STATUS COMMUNICATION OF NEEDS Skin   Extensive Assist Verbally Normal                       Personal Care Assistance Level of Assistance  Bathing, Dressing Bathing Assistance: Limited assistance   Dressing Assistance: Limited assistance     Functional Limitations Info             SPECIAL CARE FACTORS FREQUENCY  PT (By licensed PT), OT (By licensed OT)     PT Frequency: 5 OT Frequency: 5            Contractures      Additional Factors Info  Code Status, Allergies Code Status Info: Fullcode Allergies Info: NKDA           Current Medications  (05/19/2016):  This is the current hospital active medication list Current Facility-Administered Medications  Medication Dose Route Frequency Provider Last Rate Last Dose  . acetaminophen (TYLENOL) tablet 650 mg  650 mg Oral Q6H PRN Jonah BlueJennifer Yates, MD       Or  . acetaminophen (TYLENOL) suppository 650 mg  650 mg Rectal Q6H PRN Jonah BlueJennifer Yates, MD      . aspirin EC tablet 81 mg  81 mg Oral Daily Jonah BlueJennifer Yates, MD   81 mg at 05/19/16 1014  . docusate sodium (COLACE) capsule 100 mg  100 mg Oral BID Jonah BlueJennifer Yates, MD   100 mg at 05/19/16 1014  . feeding supplement (ENSURE ENLIVE) (ENSURE ENLIVE) liquid 237 mL  237 mL Oral BID BM Jonah BlueJennifer Yates, MD   237 mL at 05/19/16 1413  . finasteride (PROSCAR) tablet 5 mg  5 mg Oral QHS Jonah BlueJennifer Yates, MD   5 mg at 05/19/16 0207  . folic acid (FOLVITE) tablet 1 mg  1,000 mcg Oral Daily Jonah BlueJennifer Yates, MD   1 mg at 05/19/16 1014  . MEDLINE mouth rinse  15 mL Mouth Rinse BID Maryann Mikhail, DO   15 mL at 05/19/16 1014  . ondansetron (ZOFRAN) tablet 4 mg  4 mg Oral Q6H PRN Jonah BlueJennifer Yates, MD       Or  . ondansetron [facility](ZOFRAN) injection  4 mg  4 mg Intravenous Q6H PRN Jonah Blue, MD      . sodium chloride flush (NS) 0.9 % injection 3 mL  3 mL Intravenous Q12H Jonah Blue, MD   3 mL at 05/19/16 1016  . tamsulosin (FLOMAX) capsule 0.4 mg  0.4 mg Oral Q breakfast Jonah Blue, MD   0.4 mg at 05/19/16 1013  . traMADol (ULTRAM) tablet 50 mg  50 mg Oral QHS Jonah Blue, MD   50 mg at 05/19/16 0207     Discharge Medications: Please see discharge summary for a list of discharge medications.  Relevant Imaging Results:  Relevant Lab Results:   Additional Information SSN: [id number]  Arlyss Repress, LCSW

## 2016-05-19 NOTE — Clinical Social Work Placement (Signed)
Patient has a bed at [facility]. CSW has completed FL2 & will continue to follow and assist with discharge when ready.    [name], LCSW [facility] Clinical Social Worker cell #: [phone]     CLINICAL SOCIAL WORK PLACEMENT  NOTE  Date:  05/19/2016  Patient Details  Name: Julian Taylor MRN: [id number] Date of Birth: 02-19-1925  Clinical Social Work is seeking post-discharge placement for this patient at the Skilled  Nursing Facility level of care (*CSW will initial, date and re-position this form in  chart as items are completed):  Yes   Patient/family provided with [facility] Clinical Social Work Department's list of facilities offering this level of care within the geographic area requested by the patient (or if unable, by the patient's family).  Yes   Patient/family informed of their freedom to choose among providers that offer the needed level of care, that participate in Medicare, Medicaid or managed care program needed by the patient, have an available bed and are willing to accept the patient.  Yes   Patient/family informed of [place]'s ownership interest in [facility] and [facility], as well as of the fact that they are under no obligation to receive care at these facilities.  PASRR submitted to EDS on       PASRR number received on       Existing PASRR number confirmed on 05/19/16     FL2 transmitted to all facilities in geographic area requested by pt/family on 05/19/16     FL2 transmitted to all facilities within larger geographic area on       Patient informed that his/her managed care company has contracts with or will negotiate with certain facilities, including the following:        Yes   Patient/family informed of bed offers received.  Patient chooses bed at [facility]     Physician recommends and patient chooses bed at      Patient to be [facility] on  .  Patient  to be transferred to facility by       Patient family notified on   of transfer.  Name of family member notified:        PHYSICIAN       Additional Comment:    _______________________________________________ Arlyss RepressHarrison, Ceili Boshers F, LCSW 05/19/2016, 3:05 PM

## 2016-05-20 ENCOUNTER — Inpatient Hospital Stay (HOSPITAL_COMMUNITY): Payer: Medicare Other

## 2016-05-20 DIAGNOSIS — D649 Anemia, unspecified: Principal | ICD-10-CM

## 2016-05-20 DIAGNOSIS — R079 Chest pain, unspecified: Secondary | ICD-10-CM

## 2016-05-20 LAB — TYPE AND SCREEN
ABO/RH(D): O POS
Antibody Screen: NEGATIVE
UNIT DIVISION: 0
Unit division: 0

## 2016-05-20 LAB — CBC
HCT: 23.1 % — ABNORMAL LOW (ref 39.0–52.0)
HEMOGLOBIN: 8.3 g/dL — AB (ref 13.0–17.0)
MCH: 34.7 pg — AB (ref 26.0–34.0)
MCHC: 35.9 g/dL (ref 30.0–36.0)
MCV: 96.7 fL (ref 78.0–100.0)
Platelets: 58 10*3/uL — ABNORMAL LOW (ref 150–400)
RBC: 2.39 MIL/uL — ABNORMAL LOW (ref 4.22–5.81)
RDW: 17.7 % — ABNORMAL HIGH (ref 11.5–15.5)
WBC: 8.7 10*3/uL (ref 4.0–10.5)

## 2016-05-20 LAB — BASIC METABOLIC PANEL
ANION GAP: 7 (ref 5–15)
BUN: 29 mg/dL — AB (ref 4–21)
BUN: 29 mg/dL — AB (ref 6–20)
CHLORIDE: 105 mmol/L (ref 101–111)
CO2: 24 mmol/L (ref 22–32)
CREATININE: 1.3 mg/dL (ref 0.6–1.3)
Calcium: 8.6 mg/dL — ABNORMAL LOW (ref 8.9–10.3)
Creatinine, Ser: 1.3 mg/dL — ABNORMAL HIGH (ref 0.61–1.24)
GFR calc Af Amer: 54 mL/min — ABNORMAL LOW (ref >60)
GFR calc non Af Amer: 47 mL/min — ABNORMAL LOW (ref >60)
GLUCOSE: 112 mg/dL
Glucose, Bld: 112 mg/dL — ABNORMAL HIGH (ref 65–99)
POTASSIUM: 4.1 mmol/L (ref 3.5–5.1)
SODIUM: 136 mmol/L — AB (ref 137–147)
SODIUM: 136 mmol/L (ref 135–145)

## 2016-05-20 LAB — CBC AND DIFFERENTIAL
HEMATOCRIT: 23 % — AB (ref 41–53)
HEMOGLOBIN: 8.3 g/dL — AB (ref 13.5–17.5)
Platelets: 58 10*3/uL — AB (ref 150–399)
WBC: 8.7 10*3/mL

## 2016-05-20 LAB — ECHOCARDIOGRAM COMPLETE
Height: 72 in
WEIGHTICAEL: 2395.08 [oz_av]

## 2016-05-20 MED ORDER — TRAMADOL HCL 50 MG PO TABS
ORAL_TABLET | ORAL | 0 refills | Status: AC
Start: 2016-05-20 — End: ?

## 2016-05-20 NOTE — Care Management Note (Signed)
Case Management Note  Patient Details  Name: Julian Taylor MRN: [id number] Date of Birth: 04-14-1925  Subjective/Objective:                    Action/Plan:D/C SNF.   Expected Discharge Date:                  Expected Discharge Plan:  Skilled Nursing Facility  In-House Referral:  Clinical Social Work  Discharge planning Services  CM Consult  Post Acute Care Choice:    Choice offered to:     DME Arranged:    DME Agency:     HH Arranged:    HH Agency:     Status of Service:  Completed, signed off  If discussed at [employer] [employer] Meetings, dates discussed:    Additional Comments:  Lanier ClamMahabir, Odette Watanabe, RN 05/20/2016, 12:19 PM

## 2016-05-20 NOTE — Discharge Summary (Addendum)
Discharge Summary  Julian Taylor:[id number] DOB: [birth year]  PCP: [name], MD  Admit date: 05/18/2016 Discharge date: 05/20/2016   Recommendations for Outpatient Follow-up:  1. Dr. Donnie Aho 2-3 weeks 2. PCP 2-4 weeks  Discharge Diagnoses:  Active Hospital Problems   Diagnosis Date Noted  . Anemia 05/18/2016  . Fall 05/19/2016  . Weakness generalized 05/19/2016  . Nocturia associated with benign prostatic hypertrophy 05/19/2016  . Elevated troponin 05/19/2016  . Cardiac pacemaker in situ    Acute on CKD  Resolved Hospital Problems   Diagnosis Date Noted Date Resolved  No resolved problems to display.    Discharge Condition: Stable   Diet recommendation: Cardiac   Vitals:   05/20/16 0539 05/20/16 0900  BP: (!) 123/91 127/65  Pulse: 81 86  Resp: 16 14  Temp: 98.6 F (37 C) 98.1 F (36.7 C)    History of present illness and Hospital Course:  82 year old history of anemia, sick sinus syndrome, BPH, hypertension who presented to emergency department following a fall. Patient admitted for symptomatically anemia, found to have hemoglobin of 6.9, given blood transfusion. PT consulted for weakness and falls. Will likely need SNF.   Was given blood transfusion with good rise, and now stable hemoglobin. Was seen by cardiology due to elevated troponin. On review, felt to be related to severe anemia and related demand ischemia. Had Echo today unremarkable and pacer interrogation is pending. PT recommends SNF, he will discharged to facility today.  Hospital Course:  Principal Problem:   Anemia Active Problems:   Cardiac pacemaker in situ   Fall   Weakness generalized   Nocturia associated with benign prostatic hypertrophy   Elevated troponin  Symptomatic anemia -Patient has a history of pernicious anemia, he is approximately one week late and receiving his B-12 injection -Hemoglobin in March 2016 was approximately 11, upon admission, patient's hemoglobin  6.9 -FOBT negative -Patient given 2 units PRBC, hemoglobin currently 8.3 -Patient did have weakness and fell on the day of admission and was unable to get up.  Elevated troponin -Likely demand ischemia from anemia -Currently 0.60, will continue to trend -patient denies chest pain, EKG shows no acute ischemia or changes - seen by cards, recommend d/c today if Echo and Pacer interrogation okay  Thrombocytopenia -Platelets back in March 2016 226, currently 55 -continue to monitor CBC -Will discontinue lovenox, place on SCDs -Will order peripheral smear -Have placed call to PCP to obtain past platelet count- platelets in March 2017 were 193  Fall/Generalized weakness -Possibly secondary to anemia -PT consulted  Acute Kidney Injury on CKD  - in 2015 Cr was 1.2, this seems to be his baseline - elevated at admission and closer to baseline at discharge after fluids and blood administration  BPH -Continue Flomax and pro scar -Patient may want to follow up with urology as an outpatient  History of sick sinus syndrome -Status post pacemaker  Procedures:  Echo 9/15  Pacer interrogation 9/15 pending   Consultations:  Cardiology   Discharge Exam: BP 127/65 (BP Location: Right Arm)   Pulse 86   Temp 98.1 F (36.7 C) (Oral)   Resp 14   Ht 6' (1.829 m)   Wt 67.9 kg (149 lb 11.1 oz)   SpO2 100%   BMI 20.30 kg/m  General:  Alert, oriented, calm, in no acute distress  Eyes: EOMI, clear sclerea Neck: supple, no masses, trachea mildline  Cardiovascular: RRR, no murmurs or rubs, no peripheral edema, pacer in right chest Respiratory: clear to auscultation  bilaterally, no wheezes, no crackles  Abdomen: soft, nontender, nondistended, normal bowel tones heard  Skin: dry, no rashes  Musculoskeletal: no joint effusions, normal range of motion  Psychiatric: appropriate affect, normal speech  Neurologic: extraocular muscles intact, clear speech, moving all extremities with intact  sensorium    Discharge Instructions You were cared for by a hospitalist during your hospital stay. If you have any questions about your discharge medications or the care you received while you were in the hospital after you are discharged, you can call the unit and asked to speak with the hospitalist on call if the hospitalist that took care of you is not available. Once you are discharged, your primary care physician will handle any further medical issues. Please note that NO REFILLS for any discharge medications will be authorized once you are discharged, as it is imperative that you return to your primary care physician (or establish a relationship with a primary care physician if you do not have one) for your aftercare needs so that they can reassess your need for medications and monitor your lab values.     Medication List    TAKE these medications   aspirin EC 81 MG tablet Take 81 mg by mouth daily.   Calcium-Vitamin D 600-400 MG-UNIT Tabs Take 1 tablet by mouth daily.   cyanocobalamin 1000 MCG/ML injection Commonly known as:  (VITAMIN B-12) Inject 1,000 mcg into the muscle every 30 (thirty) days.   finasteride 5 MG tablet Commonly known as:  PROSCAR Take 5 mg by mouth at bedtime.   folic acid 400 MCG tablet Commonly known as:  FOLVITE Take 400 mcg by mouth daily.   tamsulosin 0.4 MG Caps capsule Commonly known as:  FLOMAX Take 0.4 mg by mouth daily with breakfast.   traMADol 50 MG tablet Commonly known as:  ULTRAM Take one tablet by mouth every 8 hours as needed for pain What changed:  how much to take  how to take this  when to take this  additional instructions      No Known Allergies    The results of significant diagnostics from this hospitalization (including imaging, microbiology, ancillary and laboratory) are listed below for reference.    Significant Diagnostic Studies: Dg Chest 2 View  Result Date: 05/18/2016 CLINICAL DATA:  Shortness of  breath, fall from bed. EXAM: CHEST  2 VIEW COMPARISON:  Radiographs of November 11, 2014. FINDINGS: The heart size and mediastinal contours are within normal limits. Both lungs are clear. No pneumothorax or pleural effusion is noted. Left-sided pacemaker is again noted. Atherosclerosis of thoracic aorta is noted. The visualized skeletal structures are unremarkable. IMPRESSION: Aortic atherosclerosis.  No acute cardiopulmonary abnormality seen. Electronically Signed   By: Lupita RaiderJames  Green Jr, M.D.   On: 05/18/2016 21:44    Microbiology: No results found for this or any previous visit (from the past 240 hour(s)).   Labs: Basic Metabolic Panel:  Recent Labs Lab 05/18/16 2102 05/19/16 0508 05/20/16 0521  NA 139 137 136  K 4.7 4.1 4.1  CL 106 108 105  CO2 23 23 24   GLUCOSE 135* 137* 112*  BUN 28* 27* 29*  CREATININE 1.37* 1.24 1.30*  CALCIUM 9.3 8.4* 8.6*   Liver Function Tests: No results for input(s): AST, ALT, ALKPHOS, BILITOT, PROT, ALBUMIN in the last 168 hours. No results for input(s): LIPASE, AMYLASE in the last 168 hours. No results for input(s): AMMONIA in the last 168 hours. CBC:  Recent Labs Lab 05/18/16 2102 05/19/16 [id number]  05/20/16 0521  WBC 10.2 9.8 8.7  NEUTROABS 7.3  --   --   HGB 6.9* 8.3* 8.3*  HCT 19.2* 23.3* 23.1*  MCV 104.3* 98.3 96.7  PLT 71* 55* 58*   Cardiac Enzymes:  Recent Labs Lab 05/18/16 2102 05/19/16 0140 05/19/16 0731 05/19/16 1308  CKTOTAL 205  --   --   --   TROPONINI  --  0.48* 0.60* 0.61*   BNP: BNP (last 3 results) No results for input(s): BNP in the last 8760 hours.  ProBNP (last 3 results) No results for input(s): PROBNP in the last 8760 hours.  CBG: No results for input(s): GLUCAP in the last 168 hours.  Time spent: 35 minutes were spent in preparing this discharge including medication reconciliation, counseling, and coordination of care.  Signed:  Mairead Schwarzkopf Vergie Living  [facility] 05/20/2016, 12:05 PM

## 2016-05-20 NOTE — Progress Notes (Signed)
interrogation test complete. Facility called for report, RN unavailable. Awaiting return call.

## 2016-05-20 NOTE — Plan of Care (Signed)
Problem: Safety: Goal: Ability to remain free from injury will improve Outcome: Completed/Met Date Met: 05/20/16 Discussed safety precautions/prevention measures. Pt is aware and agreeable to plan of care

## 2016-05-20 NOTE — Clinical Social Work Placement (Signed)
Patient is set to discharge to [facility] today. Patient & son, Julian MaduroRobert aware. Discharge packet given to RN, [facility]. [facility] will be called for transport once [facility] insurance authorization is obtained.      Lincoln MaxinKelly Hansika Leaming, LCSW [facility] Clinical Social Worker cell #: [phone]    CLINICAL SOCIAL WORK PLACEMENT  NOTE  Date:  05/20/2016  Patient Details  Name: Julian Taylor MRN: [id number] Date of Birth: 1925/06/15  Clinical Social Work is seeking post-discharge placement for this patient at the Skilled  Nursing Facility level of care (*CSW will initial, date and re-position this form in  chart as items are completed):  Yes   Patient/family provided with [facility] Clinical Social Work Department's list of facilities offering this level of care within the geographic area requested by the patient (or if unable, by the patient's family).  Yes   Patient/family informed of their freedom to choose among providers that offer the needed level of care, that participate in Medicare, Medicaid or managed care program needed by the patient, have an available bed and are willing to accept the patient.  Yes   Patient/family informed of [place]'s ownership interest in [facility] and [facility], as well as of the fact that they are under no obligation to receive care at these facilities.  PASRR submitted to EDS on       PASRR number received on       Existing PASRR number confirmed on 05/19/16     FL2 transmitted to all facilities in geographic area requested by pt/family on 05/19/16     FL2 transmitted to all facilities within larger geographic area on       Patient informed that his/her managed care company has contracts with or will negotiate with certain facilities, including the following:        Yes   Patient/family informed of bed offers received.  [facility]     Physician recommends and patient  chooses bed at      [facility] on 05/20/16.  Patient to be transferred to facility by [facility]     Patient family notified on 05/20/16 of transfer.  Name of family member notified:  patient's son, Julian MaduroRobert via phone     PHYSICIAN       Additional Comment:    _______________________________________________ Arlyss RepressHarrison, [name], LCSW 05/20/2016, 12:29 PM

## 2016-05-20 NOTE — Progress Notes (Signed)
[facility] called for transport.     Torrance Frech, LCSW [facility] Clinical Social Worker cell #: [phone]  

## 2016-05-20 NOTE — Progress Notes (Signed)
  Echocardiogram 2D Echocardiogram has been performed.  Janalyn HarderWest, Fronia Depass R 05/20/2016, 10:55 AM

## 2016-05-20 NOTE — Plan of Care (Signed)
Problem: Physical Regulation: Goal: Ability to maintain clinical measurements within normal limits will improve Outcome: Completed/Met Date Met: 05/20/16 Pt is aware of continued of care

## 2016-05-20 NOTE — Progress Notes (Signed)
Patient Name: Julian Taylor Date of Encounter: 05/20/2016  Principal Problem:   Anemia Active Problems:   Cardiac pacemaker in situ   Fall   Weakness generalized   Nocturia associated with benign prostatic hypertrophy   Elevated troponin   Primary Cardiologist: Dr Donnie Aho, Dr Allred Patient Profile:  82 y.o. year old male with a history of pernicious anemia, SSS s/p MDT pacer in 2015, BPH, and HTN, admitted w/ anemia and weakness after a fall, cards following for elevated troponin.  SUBJECTIVE: Breathing fine, no chest pain, feels stronger after the blood transfusion.  OBJECTIVE Vitals:   05/19/16 0800 05/19/16 1425 05/19/16 2042 05/20/16 0539  BP: 112/79 (!) 121/52 132/60 (!) 123/91  Pulse: 71 69 76 81  Resp: 16 18 18 16   Temp: 98.2 F (36.8 C) 97.8 F (36.6 C) 98.2 F (36.8 C) 98.6 F (37 C)  TempSrc: Oral Oral Oral Oral  SpO2: 98% 97% 100% 100%  Weight:      Height:        Intake/Output Summary (Last 24 hours) at 05/20/16 0750 Last data filed at 05/20/16 0700  Gross per 24 hour  Intake              120 ml  Output             1450 ml  Net            -1330 ml   Filed Weights   05/19/16 0114  Weight: 149 lb 11.1 oz (67.9 kg)    PHYSICAL EXAM General: Well developed, well nourished, male in no acute distress. Head: Normocephalic, atraumatic.  Neck: Supple without bruits, JVD not elevated Lungs:  Resp regular and unlabored, rales R base. Heart: RRR, S1, S2, no S3, S4, or murmur; no rub. Abdomen: Soft, non-tender, non-distended, BS + x 4.  Extremities: No clubbing, cyanosis, edema.  Neuro: Alert and oriented X 3. Moves all extremities spontaneously. Psych: Normal affect.  LABS: CBC: Recent Labs  05/18/16 2102 05/19/16 0508 05/20/16 0521  WBC 10.2 9.8 8.7  NEUTROABS 7.3  --   --   HGB 6.9* 8.3* 8.3*  HCT 19.2* 23.3* 23.1*  MCV 104.3* 98.3 96.7  PLT 71* 55* 58*   Basic Metabolic Panel: Recent Labs  05/19/16 0508 05/20/16 0521  NA 137 136    K 4.1 4.1  CL 108 105  CO2 23 24  GLUCOSE 137* 112*  BUN 27* 29*  CREATININE 1.24 1.30*  CALCIUM 8.4* 8.6*   Cardiac Enzymes: Recent Labs  05/18/16 2102 05/19/16 0140 05/19/16 0731 05/19/16 1308  CKTOTAL 205  --   --   --   TROPONINI  --  0.48* 0.60* 0.61*    Recent Labs  05/18/16 2115  TROPIPOC 0.22*   Anemia Panel: Recent Labs  05/18/16 2249 05/19/16 1047  VITAMINB12  --  >7,500*  FOLATE 44.1  --   FERRITIN  --  595*  TIBC  --  190*  IRON  --  173  RETICCTPCT 0.5  --     TELE:  SR, PVCs     Radiology/Studies: Dg Chest 2 View  Result Date: 05/18/2016 CLINICAL DATA:  Shortness of breath, fall from bed. EXAM: CHEST  2 VIEW COMPARISON:  Radiographs of November 11, 2014. FINDINGS: The heart size and mediastinal contours are within normal limits. Both lungs are clear. No pneumothorax or pleural effusion is noted. Left-sided pacemaker is again noted. Atherosclerosis of thoracic aorta is noted. The visualized skeletal structures  are unremarkable. IMPRESSION: Aortic atherosclerosis.  No acute cardiopulmonary abnormality seen. Electronically Signed   By: Lupita RaiderJames  Green Jr, M.D.   On: 05/18/2016 21:44     Current Medications:  . aspirin EC  81 mg Oral Daily  . docusate sodium  100 mg Oral BID  . feeding supplement (ENSURE ENLIVE)  237 mL Oral BID BM  . finasteride  5 mg Oral QHS  . folic acid  1,000 mcg Oral Daily  . mouth rinse  15 mL Mouth Rinse BID  . sodium chloride flush  3 mL Intravenous Q12H  . tamsulosin  0.4 mg Oral Q breakfast  . traMADol  50 mg Oral QHS      ASSESSMENT AND PLAN: Principal Problem:   Anemia - per IM - improved after transfusion  Active Problems:   Cardiac pacemaker in situ - we have not checked device since 2015. - not currently able to get info from Dr York Spanielilley's office  - seems to be working well, follow on telemetry, interrogate today    Fall   Weakness generalized - per IM    Nocturia associated with benign prostatic  hypertrophy - per IM    Elevated troponin - mild elevation and trending down. - echo pending - if EF nl and no WMA, MD advise on further eval.  Signed, Barrett, Rhonda , PA-C 7:50 AM 05/20/2016  The patient was seen, examined and discussed with Theodore Demarkhonda Barrett, PA-C and I agree with the above.   82 y.o. year old male with a history of pernicious anemia, SSS s/p MDT pacer in 2015, BPH, and HTN who was admitted after a fall, witnessed by his son, on admission Hb 6.9, troponin was elevated 0.48--> 0.6 --> 0.6.  Julian Taylor is not very active, because of balance issues and also is a little weak. The weakness has been going on, gradually worsening for several months.. He has had trouble with ADLs and housework because of the weakness.  He felt more weak than usual after walking to the bathroom and let himself slide to the ground, no loss of consciousness or head injury. He has obtained 2 units of PRBC, feeling better.  His echocardiogram is pending and so is PM interrogation for possible episodes of brady or tachyarrhythmias.  Physical exam is not suggestive of significant valvular abnormality or CHF. He appears dry.  He feels better today after obtaining 2 units of blood.  I would follow echo, troponin is most probably demand ischemia in the settings of severe anemia. We will treat conservatively, he is on aspirin, I would avoid betablockers with hypotension and weakness, and dizziness.   Once echo and PM interrogation done and with no significant new abnormalities, he would be stable for a discharge from cardiac standpoint.   Tobias AlexanderKatarina Mumin Denomme, MD 05/20/2016

## 2016-05-23 ENCOUNTER — Non-Acute Institutional Stay (SKILLED_NURSING_FACILITY): Payer: Medicare Other | Admitting: Internal Medicine

## 2016-05-23 ENCOUNTER — Encounter: Payer: Self-pay | Admitting: Internal Medicine

## 2016-05-23 DIAGNOSIS — D510 Vitamin B12 deficiency anemia due to intrinsic factor deficiency: Secondary | ICD-10-CM

## 2016-05-23 DIAGNOSIS — D696 Thrombocytopenia, unspecified: Secondary | ICD-10-CM

## 2016-05-23 DIAGNOSIS — D649 Anemia, unspecified: Secondary | ICD-10-CM

## 2016-05-23 DIAGNOSIS — M810 Age-related osteoporosis without current pathological fracture: Secondary | ICD-10-CM | POA: Insufficient documentation

## 2016-05-23 DIAGNOSIS — N401 Enlarged prostate with lower urinary tract symptoms: Secondary | ICD-10-CM

## 2016-05-23 DIAGNOSIS — I495 Sick sinus syndrome: Secondary | ICD-10-CM

## 2016-05-23 DIAGNOSIS — R351 Nocturia: Secondary | ICD-10-CM

## 2016-05-23 NOTE — Assessment & Plan Note (Signed)
SNF - had a fall but no fractures; Plan to cont Ca-D 600-400 one daily

## 2016-05-23 NOTE — Assessment & Plan Note (Addendum)
PLT in hospital 55, back in march 2017 193. Lovenox was stopped SNF - Bone marrow failure? will f/u CBC; have already decided to send pt to Hematology

## 2016-05-23 NOTE — Progress Notes (Signed)
Provider:  Randon GoldsmithAnne D. Lyn HollingsheadAlexander, MD Location:  [facility]   Place of Service:  SNF (31)  PCP: Georgianne FickAMACHANDRAN,AJITH, MD Patient Care Team: Georgianne FickAjith Ramachandran, MD as PCP - General (Internal Medicine)  Extended Emergency Contact Information Primary Emergency Contact: Noffsinger,Robert Address: [address]          [place], [place] [address] [place] Home Phone: [phone] Work Phone: [phone] Mobile Phone: [phone] Relation: Son Secondary Emergency Contact: Romeo RabonNunn,Eddie          , [place] Home Phone: [phone] Work Phone: [phone] Mobile Phone: [phone] Relation: Brother   Chief Complaint  Patient presents with  . New Admit To SNF    Admit to Facility    HPI: Patient is a 82 y.o. male with pernicious anemia, sick sinus syndrome s/p pacer in 2015, BPH, and HTN who presents following a fall.  Patient got up about 0830 today to go to the bathroom.  When he returned, he attempted to sit down on the bed but couldn't quite reach the bed and he slid to the floor.  He couldn't get up all day. Son came to check on him after not reaching him by telephone about 1925 and found him in the floor.  Was just too weak to get off the floor.  No myalgias.  No chest pain.  Complains of no pain at all.  Has had progressive weakness over the last 1-2 months, gradual at the beginning but worse recently.  No longer shaving or able to leave home to go out to eat.  Nocturia about q2h and so feels exhausted by the time he gets up in the mornings.[facility] where his Hb was found to be 6.9. [facility] where he was transfused and seen by cardiology for initial elevated troponin, in the end decided to be demand ischemia. Pt is admitted to SNF for gemeralized weakness for OT/PT. While at SNF pt will be followed for BPH, tx with flomax and proscar, pernicious anemia, tx with B12 and osteoporosis tx  with Ca+D.  Past Medical History:  Diagnosis Date  . BPH (benign prostatic hyperplasia)    With frequent nocturia  . Essential hypertension, benign    no longer taking medication for this  . HOH (hard of hearing)    Bilateral hearing aids  . Hyperlipidemia   . Nocturia    Frequent  . Osteoarthritis   . Osteoporosis   . Pacemaker 11/28/2013   DR ALLRED  . Pernicious anemia   . Sick sinus syndrome (HCC)   . Syncope    First happened several years ago and was better when Losartan was stopped. Started taking Losartan and Flomax and had another syncopal episode in Feb 2015.  . Tremor    Long-standing  . Unsteady gait    Uses cane  . Wears hearing aid    Both ears   Past Surgical History:  Procedure Laterality Date  . APPENDECTOMY    . CHOLECYSTECTOMY    . INSERT / REPLACE / REMOVE PACEMAKER  11/28/2013   DR Johney FrameALLRED  . PACEMAKER INSERTION  11/28/2013   MDT ADDRL1 pacemaker implanted by Dr Johney FrameAllred for SSS and symptomatic bradycardia  . PERMANENT PACEMAKER INSERTION N/A 11/28/2013   Procedure: PERMANENT PACEMAKER INSERTION;  Surgeon: Gardiner RhymeJames D Allred, MD;  Location: [facility] CATH LAB;  Service: Cardiovascular;  Laterality: N/A;  . TONSILLECTOMY AND ADENOIDECTOMY  reports that he has never smoked. He has never used smokeless tobacco. He reports that he does not drink alcohol or use drugs. Social History   Social History  . Marital status: Widowed    Spouse name: N/A  . Number of children: N/A  . Years of education: N/A   Occupational History  . retired    Social History Main Topics  . Smoking status: Never Smoker  . Smokeless tobacco: Never Used  . Alcohol use No  . Drug use: No  . Sexual activity: Not on file   Other Topics Concern  . Not on file   Social History Narrative   Lives alone in [place].  Retired from Systems developer    Family History  Problem Relation Age of Onset  . CVA Father     Health Maintenance  Topic Date Due  . Janet Berlin  12/02/1943  .  ZOSTAVAX  12/03/1986  . PNA vac Low Risk Adult (1 of 2 - PCV13) 12/03/1991  . INFLUENZA VACCINE  04/05/2016    No Known Allergies    Medication List       Accurate as of 05/23/16  7:48 PM. Always use your most recent med list.          aspirin EC 81 MG tablet Take 81 mg by mouth daily.   Calcium-Vitamin D 600-400 MG-UNIT Tabs Take 1 tablet by mouth daily.   cyanocobalamin 1000 MCG/ML injection Commonly known as:  (VITAMIN B-12) Inject 1,000 mcg into the muscle every 30 (thirty) days.   finasteride 5 MG tablet Commonly known as:  PROSCAR Take 5 mg by mouth at bedtime.   folic acid 400 MCG tablet Commonly known as:  FOLVITE Take 400 mcg by mouth daily.   tamsulosin 0.4 MG Caps capsule Commonly known as:  FLOMAX Take 0.4 mg by mouth daily with breakfast.   traMADol 50 MG tablet Commonly known as:  ULTRAM Take one tablet by mouth every 8 hours as needed for pain       Review of Systems  DATA OBTAINED: from patient GENERAL:  no fevers,+ fatigue, appetite changes SKIN: No itching, or rash EYES: No eye pain, redness, discharge EARS: No earache, tinnitus, change in hearing NOSE: No congestion, drainage or bleeding  MOUTH/THROAT: No mouth or tooth pain, No sore throat RESPIRATORY: No cough, wheezing, SOB CARDIAC: No chest pain, palpitations, lower extremity edema  GI: No abdominal pain, No N/V/D or constipation, No heartburn or reflux  GU: No dysuria, frequency or urgency, or incontinence  MUSCULOSKELETAL: No unrelieved bone/joint pain NEUROLOGIC: No headache, dizziness or focal weakness PSYCHIATRIC: No c/o anxiety or sadness   Vitals:   05/23/16 1255  BP: (!) 124/59  Pulse: 76  Resp: 16  Temp: 98.3 F (36.8 C)  Weight: 149 lb (67.6 kg)  Height: 6' (1.829 m)   Body mass index is 20.21 kg/m.  Physical Exam  GENERAL APPEARANCE: Alert, conversant,  No acute distress.  SKIN: No diaphoresis rash HEAD: Normocephalic, atraumatic  EYES: Conjunctiva/lids  clear. Pupils round, reactive. EOMs intact.  EARS: External exam WNL, canals clear. Hearing grossly normal.  NOSE: No deformity or discharge.  MOUTH/THROAT: Lips w/o lesions  RESPIRATORY: Breathing is even, unlabored. Lung sounds are clear   CARDIOVASCULAR: Heart RRR no murmurs, rubs or gallops. No peripheral edema.   GASTROINTESTINAL: Abdomen is soft, non-tender, not distended w/ normal bowel sounds. GENITOURINARY: Bladder non tender, not distended  MUSCULOSKELETAL: No abnormal joints or musculature NEUROLOGIC:  Cranial nerves 2-12 grossly intact. Moves  all extremities  PSYCHIATRIC: Mood and affect appropriate to situation, no behavioral issues  Labs reviewed: Basic Metabolic Panel:  Recent Labs  16/10/96 2102 05/19/16 05/19/16 0508 05/20/16 05/20/16 0521  NA 139 137 137 136* 136  K 4.7 4.1 4.1  --  4.1  CL 106  --  108  --  105  CO2 23  --  23  --  24  GLUCOSE 135*  --  137*  --  112*  BUN 28* 27* 27* 29* 29*  CREATININE 1.37* 1.2 1.24 1.3 1.30*  CALCIUM 9.3  --  8.4*  --  8.6*   Liver Function Tests: No results for input(s): AST, ALT, ALKPHOS, BILITOT, PROT, ALBUMIN in the last 8760 hours. No results for input(s): LIPASE, AMYLASE in the last 8760 hours. No results for input(s): AMMONIA in the last 8760 hours. CBC:  Recent Labs  05/18/16 2102  05/19/16 0508 05/20/16 05/20/16 0521  WBC 10.2  < > 9.8 8.7 8.7  NEUTROABS 7.3  --   --   --   --   HGB 6.9*  < > 8.3* 8.3* 8.3*  HCT 19.2*  < > 23.3* 23* 23.1*  MCV 104.3*  --  98.3  --  96.7  PLT 71*  < > 55* 58* 58*  < > = values in this interval not displayed. Lipid No results for input(s): CHOL, HDL, LDLCALC, TRIG in the last 8760 hours.  Cardiac Enzymes:  Recent Labs  05/18/16 2102 05/19/16 0140 05/19/16 0731 05/19/16 1308  CKTOTAL 205  --   --   --   TROPONINI  --  0.48* 0.60* 0.61*   BNP: No results for input(s): BNP in the last 8760 hours. No results found for: MICROALBUR No results found for:  HGBA1C No results found for: TSH Lab Results  Component Value Date   VITAMINB12 >7,500 (H) 05/19/2016   Lab Results  Component Value Date   FOLATE 44.1 05/18/2016   Lab Results  Component Value Date   IRON 173 05/19/2016   TIBC 190 (L) 05/19/2016   FERRITIN 595 (H) 05/19/2016    Imaging and Procedures obtained prior to SNF admission: Dg Chest 2 View  Result Date: 05/18/2016 CLINICAL DATA:  Shortness of breath, fall from bed. EXAM: CHEST  2 VIEW COMPARISON:  Radiographs of November 11, 2014. FINDINGS: The heart size and mediastinal contours are within normal limits. Both lungs are clear. No pneumothorax or pleural effusion is noted. Left-sided pacemaker is again noted. Atherosclerosis of thoracic aorta is noted. The visualized skeletal structures are unremarkable. IMPRESSION: Aortic atherosclerosis.  No acute cardiopulmonary abnormality seen. Electronically Signed   By: Lupita Raider, M.D.   On: 05/18/2016 21:44    Labs, X rays and other studies from outside the [facility] system do not show up in EPIC; however the hard copies are reviewed by me.  Assessment/Plan  SYMPTOMATIC ANEMIA - h/o pernicious anemia being tx with B12 injections ; Pt's Hb in ED was 6.9, , was FOBT heme neg, was tx with 2 uPRBC and d/c Hb 8.3 SNF - will f/u CBc 5 days   Pernicious anemia SNF - has been getting B12 injections and he missed one and initially that was thought to be the cause of his los Hb but his B12 level is 7500, his folate level 44 and his retic count was low; he has substrate but his BM is not making it; plan to send to [facility] for eval; cont Band folate until we grt new  rec from Hematology   Thrombocytopenia (HCC) PLT in hospital 55, back in march 2017 193. Lovenox was stopped SNF - Bone marrow failure? will f/u CBC; have already decided to send pt to Hematology  Nocturia associated with benign prostatic hypertrophy SNF - no reported problems;plan to cont proscar and  flomax  Osteoporosis SNF - had a fall but no fractures; Plan to cont Ca-D 600-400 one daily  Sick sinus syndrome SNF - pacemaker in place;pm was interogated while at hospital    Labs/tests ordered: CBC, BMP 5 days  Time spent > 45 min;> 50% of time with patient was spent reviewing records, labs, tests and studies, counseling and developing plan of care  Thurston Hole D. Lyn Hollingshead, MD

## 2016-05-23 NOTE — Assessment & Plan Note (Signed)
SNF - no reported problems;plan to cont proscar and flomax

## 2016-05-23 NOTE — Assessment & Plan Note (Addendum)
SNF - has been getting B12 injections and he missed one and initially that was thought to be the cause of his los Hb but his B12 level is 7500, his folate level 44 and his retic count was low; he has substrate but his BM is not making it; [facility]; cont Band folate until we grt new rec from Hematology

## 2016-05-23 NOTE — Assessment & Plan Note (Signed)
SNF - pacemaker in place;pm was interogated while at hospital

## 2016-05-31 LAB — BASIC METABOLIC PANEL
BUN: 28 mg/dL — AB (ref 4–21)
CREATININE: 1.2 mg/dL (ref 0.6–1.3)
Glucose: 173 mg/dL
Potassium: 4 mmol/L (ref 3.4–5.3)
Sodium: 138 mmol/L (ref 137–147)

## 2016-05-31 LAB — CBC AND DIFFERENTIAL
HEMATOCRIT: 23 % — AB (ref 41–53)
HEMOGLOBIN: 7.8 g/dL — AB (ref 13.5–17.5)
PLATELETS: 61 10*3/uL — AB (ref 150–399)
WBC: 9.3 10*3/mL

## 2016-06-06 ENCOUNTER — Telehealth: Payer: Self-pay | Admitting: Hematology and Oncology

## 2016-06-06 NOTE — Telephone Encounter (Signed)
Appt scheduled w/Gudena at 1pm. Spoke to the pt's son, [name] and gave the appt date and time. Aware to have his father here 30 minutes early. Demographics verified.

## 2016-06-15 ENCOUNTER — Non-Acute Institutional Stay (SKILLED_NURSING_FACILITY): Payer: Medicare Other | Admitting: Internal Medicine

## 2016-06-15 ENCOUNTER — Encounter: Payer: Self-pay | Admitting: Internal Medicine

## 2016-06-15 DIAGNOSIS — R42 Dizziness and giddiness: Secondary | ICD-10-CM | POA: Diagnosis not present

## 2016-06-15 NOTE — Progress Notes (Signed)
[facility] Room Number: 504 P Place of Service:  SNF (31)  Randon Goldsmithnne D. Lyn HollingsheadAlexander, MD  Patient Care Team: Georgianne FickAjith Ramachandran, MD as PCP - General (Internal Medicine)  Extended Emergency Contact Information Primary Emergency Contact: Aracena,Robert Address: [address]          [place], [place] [address] [place] Home Phone: [phone] Work Phone: [phone] Mobile Phone: [phone] Relation: Son Secondary Emergency Contact: Romeo RabonNunn,Eddie          [place], [place] Home Phone: [phone] Work Phone: [phone] Mobile Phone: [phone] Relation: Brother    Allergies: Review of patient's allergies indicates no known allergies.  Chief Complaint  Patient presents with  . Acute Visit    Acute    HPI: Patient is 82 y.o. male who nursing asked me to see today. Pt got dizzy in rehab today. He went to rehab before breakfast. Pt says she feels OK now,  VS are stable. No CP or SOB or weakness.  Past Medical History:  Diagnosis Date  . BPH (benign prostatic hyperplasia)    With frequent nocturia  . Essential hypertension, benign    no longer taking medication for this  . HOH (hard of hearing)    Bilateral hearing aids  . Hyperlipidemia   . Nocturia    Frequent  . Osteoarthritis   . Osteoporosis   . Pacemaker 11/28/2013   DR ALLRED  . Pernicious anemia   . Sick sinus syndrome (HCC)   . Syncope    First happened several years ago and was better when Losartan was stopped. Started taking Losartan and Flomax and had another syncopal episode in Feb 2015.  . Tremor    Long-standing  . Unsteady gait    Uses cane  . Wears hearing aid    Both ears    Past Surgical History:  Procedure Laterality Date  . APPENDECTOMY    . CHOLECYSTECTOMY    . INSERT / REPLACE / REMOVE PACEMAKER  11/28/2013   DR Johney FrameALLRED  . PACEMAKER INSERTION  11/28/2013   MDT ADDRL1 pacemaker implanted by Dr Johney FrameAllred for  SSS and symptomatic bradycardia  . PERMANENT PACEMAKER INSERTION N/A 11/28/2013   Procedure: PERMANENT PACEMAKER INSERTION;  Surgeon: Gardiner RhymeJames D Allred, MD;  Location: [facility] CATH LAB;  Service: Cardiovascular;  Laterality: N/A;  . TONSILLECTOMY AND ADENOIDECTOMY        Medication List       Accurate as of 06/15/16  2:28 PM. Always use your most recent med list.          aspirin EC 81 MG tablet Take 81 mg by mouth daily.   Calcium-Vitamin D 600-400 MG-UNIT Tabs Take 1 tablet by mouth daily.   cyanocobalamin 1000 MCG/ML injection Commonly known as:  (VITAMIN B-12) Inject 1,000 mcg into the muscle every 30 (thirty) days.   docusate sodium 100 MG capsule Commonly known as:  COLACE Take 100 mg by mouth 2 (two) times daily.   ENSURE Take 237 mLs by mouth daily.   finasteride 5 MG tablet Commonly known as:  PROSCAR Take 5 mg by mouth at bedtime.   folic acid 400 MCG tablet Commonly known as:  FOLVITE Take 400 mcg by mouth daily.   tamsulosin 0.4 MG Caps capsule Commonly known as:  FLOMAX Take 0.4 mg by mouth daily with breakfast.   traMADol 50 MG tablet Commonly known as:  ULTRAM Take one tablet by mouth every 8 hours as  needed for pain       No orders of the defined types were placed in this encounter.   Immunization History  Administered Date(s) Administered  . Influenza-Unspecified 06/07/2016  . PPD Test 05/20/2016    Social History  Substance Use Topics  . Smoking status: Never Smoker  . Smokeless tobacco: Never Used  . Alcohol use No    Review of Systems  DATA OBTAINED: from patient GENERAL:  no fevers, fatigue, appetite changes SKIN: No itching, rash HEENT: No complaint RESPIRATORY: No cough, wheezing, SOB CARDIAC: No chest pain, palpitations, lower extremity edema  GI: No abdominal pain, No N/V/D or constipation, No heartburn or reflux  GU: No dysuria, frequency or urgency, or incontinence  MUSCULOSKELETAL: No unrelieved bone/joint  pain NEUROLOGIC: No headache, dizziness; felt lightheaded earlier in rehab but feels fine now.  PSYCHIATRIC: No overt anxiety or sadness  Vitals:   06/15/16 1419  BP: 128/72  Pulse: 76  Resp: 20  Temp: 98 F (36.7 C)   Body mass index is 21.13 kg/m. Physical Exam  GENERAL APPEARANCE: Alert, conversant, No acute distress  SKIN: No diaphoresis rash; mild pallor HEENT: Unremarkable RESPIRATORY: Breathing is even, unlabored. Lung sounds are clear   CARDIOVASCULAR: Heart RRR no murmurs, rubs or gallops. No peripheral edema  GASTROINTESTINAL: Abdomen is soft, non-tender, not distended w/ normal bowel sounds.  GENITOURINARY: Bladder non tender, not distended  MUSCULOSKELETAL: No abnormal joints or musculature NEUROLOGIC: Cranial nerves 2-12 grossly intact. Moves all extremities PSYCHIATRIC: Mood and affect appropriate to situation, no behavioral issues  Patient Active Problem List   Diagnosis Date Noted  . Thrombocytopenia (HCC) 05/23/2016  . Osteoporosis 05/23/2016  . Fall 05/19/2016  . Weakness generalized 05/19/2016  . Nocturia associated with benign prostatic hypertrophy 05/19/2016  . Elevated troponin 05/19/2016  . Chronic kidney disease (CKD), stage III (moderate) 05/19/2016  . Pernicious anemia 05/18/2016  . Cardiac pacemaker in situ   . Sick sinus syndrome (HCC) 11/26/2013  . Essential hypertension 11/26/2013    CMP     Component Value Date/Time   NA 138 05/31/2016   K 4.0 05/31/2016   CL 105 05/20/2016 0521   CO2 24 05/20/2016 0521   GLUCOSE 112 (H) 05/20/2016 0521   BUN 28 (A) 05/31/2016   CREATININE 1.2 05/31/2016   CREATININE 1.30 (H) 05/20/2016 0521   CALCIUM 8.6 (L) 05/20/2016 0521   GFRNONAA 47 (L) 05/20/2016 0521   GFRAA 54 (L) 05/20/2016 0521    Recent Labs  05/18/16 2102  05/19/16 0508 05/20/16 05/20/16 0521 05/31/16  NA 139  < > 137 136* 136 138  K 4.7  < > 4.1  --  4.1 4.0  CL 106  --  108  --  105  --   CO2 23  --  23  --  24  --    GLUCOSE 135*  --  137*  --  112*  --   BUN 28*  < > 27* 29* 29* 28*  CREATININE 1.37*  < > 1.24 1.3 1.30* 1.2  CALCIUM 9.3  --  8.4*  --  8.6*  --   < > = values in this interval not displayed. No results for input(s): AST, ALT, ALKPHOS, BILITOT, PROT, ALBUMIN in the last 8760 hours.  Recent Labs  05/18/16 2102  05/19/16 0508 05/20/16 05/20/16 0521 05/31/16  WBC 10.2  < > 9.8 8.7 8.7 9.3  NEUTROABS 7.3  --   --   --   --   --  HGB 6.9*  < > 8.3* 8.3* 8.3* 7.8*  HCT 19.2*  < > 23.3* 23* 23.1* 23*  MCV 104.3*  --  98.3  --  96.7  --   PLT 71*  < > 55* 58* 58* 61*  < > = values in this interval not displayed. No results for input(s): CHOL, LDLCALC, TRIG in the last 8760 hours.  Invalid input(s): HCL No results found for: MICROALBUR No results found for: TSH No results found for: HGBA1C No results found for: CHOL, HDL, LDLCALC, LDLDIRECT, TRIG, CHOLHDL  Significant Diagnostic Results in last 30 days:  Dg Chest 2 View  Result Date: 05/18/2016 CLINICAL DATA:  Shortness of breath, fall from bed. EXAM: CHEST  2 VIEW COMPARISON:  Radiographs of November 11, 2014. FINDINGS: The heart size and mediastinal contours are within normal limits. Both lungs are clear. No pneumothorax or pleural effusion is noted. Left-sided pacemaker is again noted. Atherosclerosis of thoracic aorta is noted. The visualized skeletal structures are unremarkable. IMPRESSION: Aortic atherosclerosis.  No acute cardiopulmonary abnormality seen. Electronically Signed   By: Lupita Raider, M.D.   On: 05/18/2016 21:44    Assessment and Plan  DIZZY EPISODE - may be because rehab on empty stomach;will make sure pt has breakfast first; main concern will be to r/o anemia; Monette drew his CBC today because the one drawn earlier wasn't performed  by lab   Time spent > 25 min;> 50% of time with patient was spent reviewing records, labs, tests and studies, counseling and developing plan of care  Thurston Hole D. Lyn Hollingshead, MD

## 2016-06-17 ENCOUNTER — Encounter (HOSPITAL_COMMUNITY): Payer: Self-pay | Admitting: Emergency Medicine

## 2016-06-17 ENCOUNTER — Emergency Department (HOSPITAL_COMMUNITY)
Admission: EM | Admit: 2016-06-17 | Discharge: 2016-07-06 | Disposition: E | Payer: Medicare Other | Attending: Emergency Medicine | Admitting: Emergency Medicine

## 2016-06-17 ENCOUNTER — Emergency Department (HOSPITAL_COMMUNITY): Payer: Medicare Other

## 2016-06-17 DIAGNOSIS — Z7982 Long term (current) use of aspirin: Secondary | ICD-10-CM | POA: Insufficient documentation

## 2016-06-17 DIAGNOSIS — N183 Chronic kidney disease, stage 3 (moderate): Secondary | ICD-10-CM | POA: Diagnosis not present

## 2016-06-17 DIAGNOSIS — I469 Cardiac arrest, cause unspecified: Secondary | ICD-10-CM | POA: Diagnosis not present

## 2016-06-17 DIAGNOSIS — Z950 Presence of cardiac pacemaker: Secondary | ICD-10-CM | POA: Insufficient documentation

## 2016-06-17 DIAGNOSIS — I129 Hypertensive chronic kidney disease with stage 1 through stage 4 chronic kidney disease, or unspecified chronic kidney disease: Secondary | ICD-10-CM | POA: Insufficient documentation

## 2016-06-17 DIAGNOSIS — R0602 Shortness of breath: Secondary | ICD-10-CM | POA: Diagnosis present

## 2016-06-17 LAB — I-STAT VENOUS BLOOD GAS, ED
Acid-base deficit: 21 mmol/L — ABNORMAL HIGH (ref 0.0–2.0)
BICARBONATE: 8.1 mmol/L — AB (ref 20.0–28.0)
O2 Saturation: 29 %
PCO2 VEN: 30.7 mmHg — AB (ref 44.0–60.0)
TCO2: 9 mmol/L (ref 0–100)
pH, Ven: 7.027 — CL (ref 7.250–7.430)
pO2, Ven: 27 mmHg — CL (ref 32.0–45.0)

## 2016-06-17 LAB — COMPREHENSIVE METABOLIC PANEL
ALK PHOS: 85 U/L (ref 38–126)
ALT: 17 U/L (ref 17–63)
AST: 62 U/L — AB (ref 15–41)
Albumin: 2.7 g/dL — ABNORMAL LOW (ref 3.5–5.0)
Anion gap: 19 — ABNORMAL HIGH (ref 5–15)
BILIRUBIN TOTAL: 0.4 mg/dL (ref 0.3–1.2)
BUN: 46 mg/dL — AB (ref 6–20)
CALCIUM: 8.9 mg/dL (ref 8.9–10.3)
CHLORIDE: 106 mmol/L (ref 101–111)
CO2: 9 mmol/L — ABNORMAL LOW (ref 22–32)
CREATININE: 2.15 mg/dL — AB (ref 0.61–1.24)
GFR calc Af Amer: 30 mL/min — ABNORMAL LOW (ref >60)
GFR, EST NON AFRICAN AMERICAN: 26 mL/min — AB (ref >60)
Glucose, Bld: 295 mg/dL — ABNORMAL HIGH (ref 65–99)
Potassium: 5 mmol/L (ref 3.5–5.1)
Sodium: 134 mmol/L — ABNORMAL LOW (ref 135–145)
Total Protein: 5.9 g/dL — ABNORMAL LOW (ref 6.5–8.1)

## 2016-06-17 LAB — CBC WITH DIFFERENTIAL/PLATELET
BASOS PCT: 0 %
Basophils Absolute: 0 10*3/uL (ref 0.0–0.1)
EOS PCT: 0 %
Eosinophils Absolute: 0 10*3/uL (ref 0.0–0.7)
HEMATOCRIT: 16.6 % — AB (ref 39.0–52.0)
HEMOGLOBIN: 5.6 g/dL — AB (ref 13.0–17.0)
LYMPHS PCT: 11 %
Lymphs Abs: 2.6 10*3/uL (ref 0.7–4.0)
MCH: 33.5 pg (ref 26.0–34.0)
MCHC: 33.7 g/dL (ref 30.0–36.0)
MCV: 99.4 fL (ref 78.0–100.0)
MONOS PCT: 4 %
Monocytes Absolute: 0.9 10*3/uL (ref 0.1–1.0)
NEUTROS PCT: 85 %
Neutro Abs: 20.2 10*3/uL — ABNORMAL HIGH (ref 1.7–7.7)
PLATELETS: 75 10*3/uL — AB (ref 150–400)
RBC: 1.67 MIL/uL — AB (ref 4.22–5.81)
RDW: 16.9 % — ABNORMAL HIGH (ref 11.5–15.5)
WBC: 23.7 10*3/uL — AB (ref 4.0–10.5)

## 2016-06-17 LAB — TYPE AND SCREEN
ABO/RH(D): O POS
Antibody Screen: NEGATIVE

## 2016-06-17 LAB — I-STAT CHEM 8, ED
BUN: 54 mg/dL — ABNORMAL HIGH (ref 6–20)
CALCIUM ION: 1.22 mmol/L (ref 1.15–1.40)
CHLORIDE: 107 mmol/L (ref 101–111)
Creatinine, Ser: 1.9 mg/dL — ABNORMAL HIGH (ref 0.61–1.24)
GLUCOSE: 279 mg/dL — AB (ref 65–99)
HCT: 15 % — ABNORMAL LOW (ref 39.0–52.0)
Hemoglobin: 5.1 g/dL — CL (ref 13.0–17.0)
Potassium: 5 mmol/L (ref 3.5–5.1)
SODIUM: 134 mmol/L — AB (ref 135–145)
TCO2: 11 mmol/L (ref 0–100)

## 2016-06-17 LAB — I-STAT CG4 LACTIC ACID, ED: Lactic Acid, Venous: 11.79 mmol/L (ref 0.5–1.9)

## 2016-06-17 LAB — I-STAT TROPONIN, ED: TROPONIN I, POC: 9.47 ng/mL — AB (ref 0.00–0.08)

## 2016-06-17 MED ORDER — SODIUM CHLORIDE 0.9 % IJ SOLN
INTRAMUSCULAR | Status: AC
  Filled 2016-06-17: qty 30

## 2016-06-17 MED ORDER — ALBUTEROL (5 MG/ML) CONTINUOUS INHALATION SOLN
INHALATION_SOLUTION | RESPIRATORY_TRACT | Status: AC
  Filled 2016-06-17: qty 20

## 2016-06-18 LAB — ABO/RH: ABO/RH(D): O POS

## 2016-06-19 ENCOUNTER — Encounter: Payer: Self-pay | Admitting: Internal Medicine

## 2016-07-05 ENCOUNTER — Encounter: Payer: Medicare Other | Admitting: Hematology and Oncology

## 2016-07-06 NOTE — ED Triage Notes (Signed)
Pt was nursing home rehab and had some wheezing and laBORED BREATHING WAS GIVEN DUPO NEB BY  FACILITY, upon ems arrival pt had decreased  Breaths sounds thru out and given 2 n dup neb for total 1 mg or atrovent and 100 of albuteral sats upon ems arrival were 85 % ra ,  Was being assisted in  Eating and may have chocked and there was vomited on side of bed CBG was 351, pt arrives in room and was not responsive to voice or sternal ru , dr Samara Snidereeses at bedside and spoke to son , pt remains non responsive with low bp , per ems bp was low with them also

## 2016-07-06 NOTE — ED Provider Notes (Signed)
[facility]-EM[facility]Y [facility] Provider Note   CSN: [id number] Arrival date & time: 29-Jun-2016  1041     History   Chief Complaint Chief Complaint  Patient presents with  . Shortness of Breath    HPI ADRIN Taylor is a 82 y.o. male.  The history is provided by the EMS personnel and a relative. No language interpreter was used.   Taylor Taylor is a 82 y.o. male who presents to the Emergency Department complaining of SOB.  Level V caveat due to unresponsiveness.  History is provided by EMS and son. Per EMS patient presents for evaluation of shortness of breath over the last few days. He was hypoxic on their arrival with increased work of breathing but conversant.  He received a nebulizer prior to ED arrival.  Per son patient has complained of progressive generalized weakness and was transferred from assisted living to skilled nursing facility yesterday. He has had increased shortness of breath over the last 2 days and had a coughing spell while he was eating breakfast this morning.  Past Medical History:  Diagnosis Date  . BPH (benign prostatic hyperplasia)    With frequent nocturia  . Essential hypertension, benign    no longer taking medication for this  . HOH (hard of hearing)    Bilateral hearing aids  . Hyperlipidemia   . Nocturia    Frequent  . Osteoarthritis   . Osteoporosis   . Pacemaker 11/28/2013   DR ALLRED  . Pernicious anemia   . Sick sinus syndrome (HCC)   . Syncope    First happened several years ago and was better when Losartan was stopped. Started taking Losartan and Flomax and had another syncopal episode in Feb 2015.  . Tremor    Long-standing  . Unsteady gait    Uses cane  . Wears hearing aid    Both ears    Patient Active Problem List   Diagnosis Date Noted  . Thrombocytopenia (HCC) 05/23/2016  . Osteoporosis 05/23/2016  . Fall 05/19/2016  . Weakness generalized 05/19/2016  . Nocturia associated with benign prostatic hypertrophy 05/19/2016  . Elevated  troponin 05/19/2016  . Chronic kidney disease (CKD), stage III (moderate) 05/19/2016  . Pernicious anemia 05/18/2016  . Cardiac pacemaker in situ   . Sick sinus syndrome (HCC) 11/26/2013  . Essential hypertension 11/26/2013    Past Surgical History:  Procedure Laterality Date  . APPENDECTOMY    . CHOLECYSTECTOMY    . INSERT / REPLACE / REMOVE PACEMAKER  11/28/2013   DR Johney Frame  . PACEMAKER INSERTION  11/28/2013   MDT ADDRL1 pacemaker implanted by Dr Johney Frame for SSS and symptomatic bradycardia  . PERMANENT PACEMAKER INSERTION N/A 11/28/2013   Procedure: PERMANENT PACEMAKER INSERTION;  Surgeon: Gardiner Rhyme, MD;  Location: [facility] CATH [facility];  Service: Cardiovascular;  Laterality: N/A;  . TONSILLECTOMY AND ADENOIDECTOMY         Home Medications    Prior to Admission medications   Medication Sig Start Date End Date Taking? Authorizing Provider  aspirin EC 81 MG tablet Take 81 mg by mouth daily.   Yes Historical Provider, MD  Calcium Carb-Cholecalciferol (CALCIUM-VITAMIN D) 600-400 MG-UNIT TABS Take 1 tablet by mouth daily.   Yes Historical Provider, MD  cyanocobalamin (,VITAMIN B-12,) 1000 MCG/ML injection Inject 1,000 mcg into the muscle every 30 (thirty) days.    Yes Historical Provider, MD  docusate sodium (COLACE) 100 MG capsule Take 100 mg by mouth 2 (two) times daily.   Yes Historical Provider,  MD  ENSURE (ENSURE) Take 237 mLs by mouth daily.   Yes Historical Provider, MD  finasteride (PROSCAR) 5 MG tablet Take 5 mg by mouth at bedtime. 05/15/16  Yes Historical Provider, MD  folic acid (FOLVITE) 400 MCG tablet Take 400 mcg by mouth daily.   Yes Historical Provider, MD  tamsulosin (FLOMAX) 0.4 MG CAPS capsule Take 0.4 mg by mouth daily with breakfast.  10/07/13  Yes Historical Provider, MD  traMADol Janean Sark(ULTRAM) 50 MG tablet Take one tablet by mouth every 8 hours as needed for pain 05/20/16  Yes Mir Vergie LivingMohammed Ikramullah, MD    Family History Family History  Problem Relation Age of Onset  .  CVA Father     Social History Social History  Substance Use Topics  . Smoking status: Never Smoker  . Smokeless tobacco: Never Used  . Alcohol use No     Allergies   Review of patient's allergies indicates no known allergies.   Review of Systems Review of Systems  Unable to perform ROS: Patient unresponsive     Physical Exam Updated Vital Signs BP (!) 77/51 (BP Location: Left Arm)   Pulse 80   Resp 21   SpO2 (!) 88%   Physical Exam  Constitutional: He appears distressed.  HENT:  Head: Normocephalic and atraumatic.  Cardiovascular: Normal rate and regular rhythm.   Pulmonary/Chest: He is in respiratory distress.  Tachypnea with accessory muscle use, diffuse wheezes and rhonchi bilaterally  Abdominal: Soft. There is no tenderness. There is no rebound and no guarding.  Musculoskeletal: He exhibits no edema or tenderness.  Neurological:  Lethargic. Eyes open spontaneously. Does not respond to verbal or painful stimuli.  Skin: There is pallor.  Psychiatric:  Unable to assess  Nursing note and vitals reviewed.    ED Treatments / Results  Labs (all labs ordered are listed, but only abnormal results are displayed) Labs Reviewed  I-STAT CHEM 8, ED - Abnormal; Notable for the following:       Result Value   Sodium 134 (*)    BUN 54 (*)    Creatinine, Ser 1.90 (*)    Glucose, Bld 279 (*)    Hemoglobin 5.1 (*)    HCT 15.0 (*)    All other components within normal limits  I-STAT VENOUS BLOOD GAS, ED - Abnormal; Notable for the following:    pH, Ven 7.027 (*)    pCO2, Ven 30.7 (*)    pO2, Ven 27.0 (*)    Bicarbonate 8.1 (*)    Acid-base deficit 21.0 (*)    All other components within normal limits  I-STAT CG4 LACTIC ACID, ED - Abnormal; Notable for the following:    Lactic Acid, Venous 11.79 (*)    All other components within normal limits  I-STAT TROPOININ, ED - Abnormal; Notable for the following:    Troponin i, poc 9.47 (*)    All other components within  normal limits  CBC WITH DIFFERENTIAL/PLATELET  COMPREHENSIVE METABOLIC PANEL  URINALYSIS, ROUTINE W REFLEX MICROSCOPIC (NOT [facility])  TYPE AND SCREEN    EKG  EKG Interpretation None       Radiology Dg Chest Port 1 View  Result Date: 01/04/16 CLINICAL DATA:  Wheezing and shortness of breath EXAM: PORTABLE CHEST 1 VIEW COMPARISON:  May 18, 2016 FINDINGS: There is interstitial and alveolar edema throughout the lungs bilaterally. There is a small left pleural effusion. There is cardiomegaly with pulmonary venous hypertension. Pacemaker leads are attached to the right atrium and right  ventricle. There is atherosclerotic calcification in the aorta. No pneumothorax. No evident adenopathy. IMPRESSION: Congestive heart failure with interstitial and alveolar edema. Pacemaker leads are attached to the right atrium and right ventricle. No pneumothorax. There is aortic atherosclerosis. Electronically Signed   By: Bretta Bang III M.D.   On: 01-Jul-2016 11:25    Procedures Procedures (including critical care time)  Medications Ordered in ED Medications  albuterol (PROVENTIL, VENTOLIN) (5 MG/ML) 0.5% continuous inhalation solution (not administered)  sodium chloride 0.9 % injection (not administered)     Initial Impression / Assessment and Plan / ED Course  I have reviewed the triage vital signs and the nursing notes.  Pertinent labs & imaging results that were available during my care of the patient were reviewed by me and considered in my medical decision making (see chart for details).  Clinical Course    Patient presents for shortness of breath and respiratory distress. Patient critically ill on ED arrival and minimally responsive. Discussed with patient's son on ED arrival the goals of care. The patient had previously expressed attempted resuscitation but if prognosis was poor he would not want interventions performed. Discussed with son transitioning to comfort measures and  son was in agreement. Patient expired in the emergency department at 11:30 am.   Final Clinical Impressions(s) / ED Diagnoses   Final diagnoses:  Cardiac arrest [facility])    New Prescriptions New Prescriptions   No medications on file     Tilden Fossa, MD 06/18/16 [phone]

## 2016-07-06 NOTE — ED Notes (Signed)
resp in rom and was prepping to give breathing tx when son states he thinks pt had stopped breathing, dr Pecola Leisurereese had spoken to son about preliminary lab results being so so bad and son states that he does not want pt to have cpr. Dr Pecola Leisurereese in room and pt TOD is 1130

## 2016-07-06 NOTE — Progress Notes (Signed)
While rounding in ED I responded to call from unit secretary to support patients' son at bedside. Patient suddenly passed. Son [name] at bedside and another brother local is in route to hospital. Sister and brother from out of town being notified by Molly Maduroobert.  Patient son had not yet determined what funeral home. Patient placement card was given to son. Son wanted time to be along with father. Provided ministry of presence, empathetic listening, emotional and spiritual support.    28-Jul-2016 1100  Clinical Encounter Type  Visited With Patient and family together;Health care provider  Visit Type Initial;Spiritual support;Death  Referral From Nurse  Spiritual Encounters  Spiritual Needs Emotional;Grief support  Stress Factors  Family Stress Factors Family relationships;Loss  Venida JarvisWatlington, Zarif Rathje, Chaplain,[facility],pager [phone]

## 2016-07-06 DEATH — deceased

## 2017-10-18 IMAGING — CR DG CHEST 2V
2 series · 2 of 2 positions shown · non-contrast
Comparison: Radiographs November 11, 2014.

CLINICAL DATA: Shortness of breath, fall from bed.

EXAM:
CHEST  2 VIEW

[w chest lat]
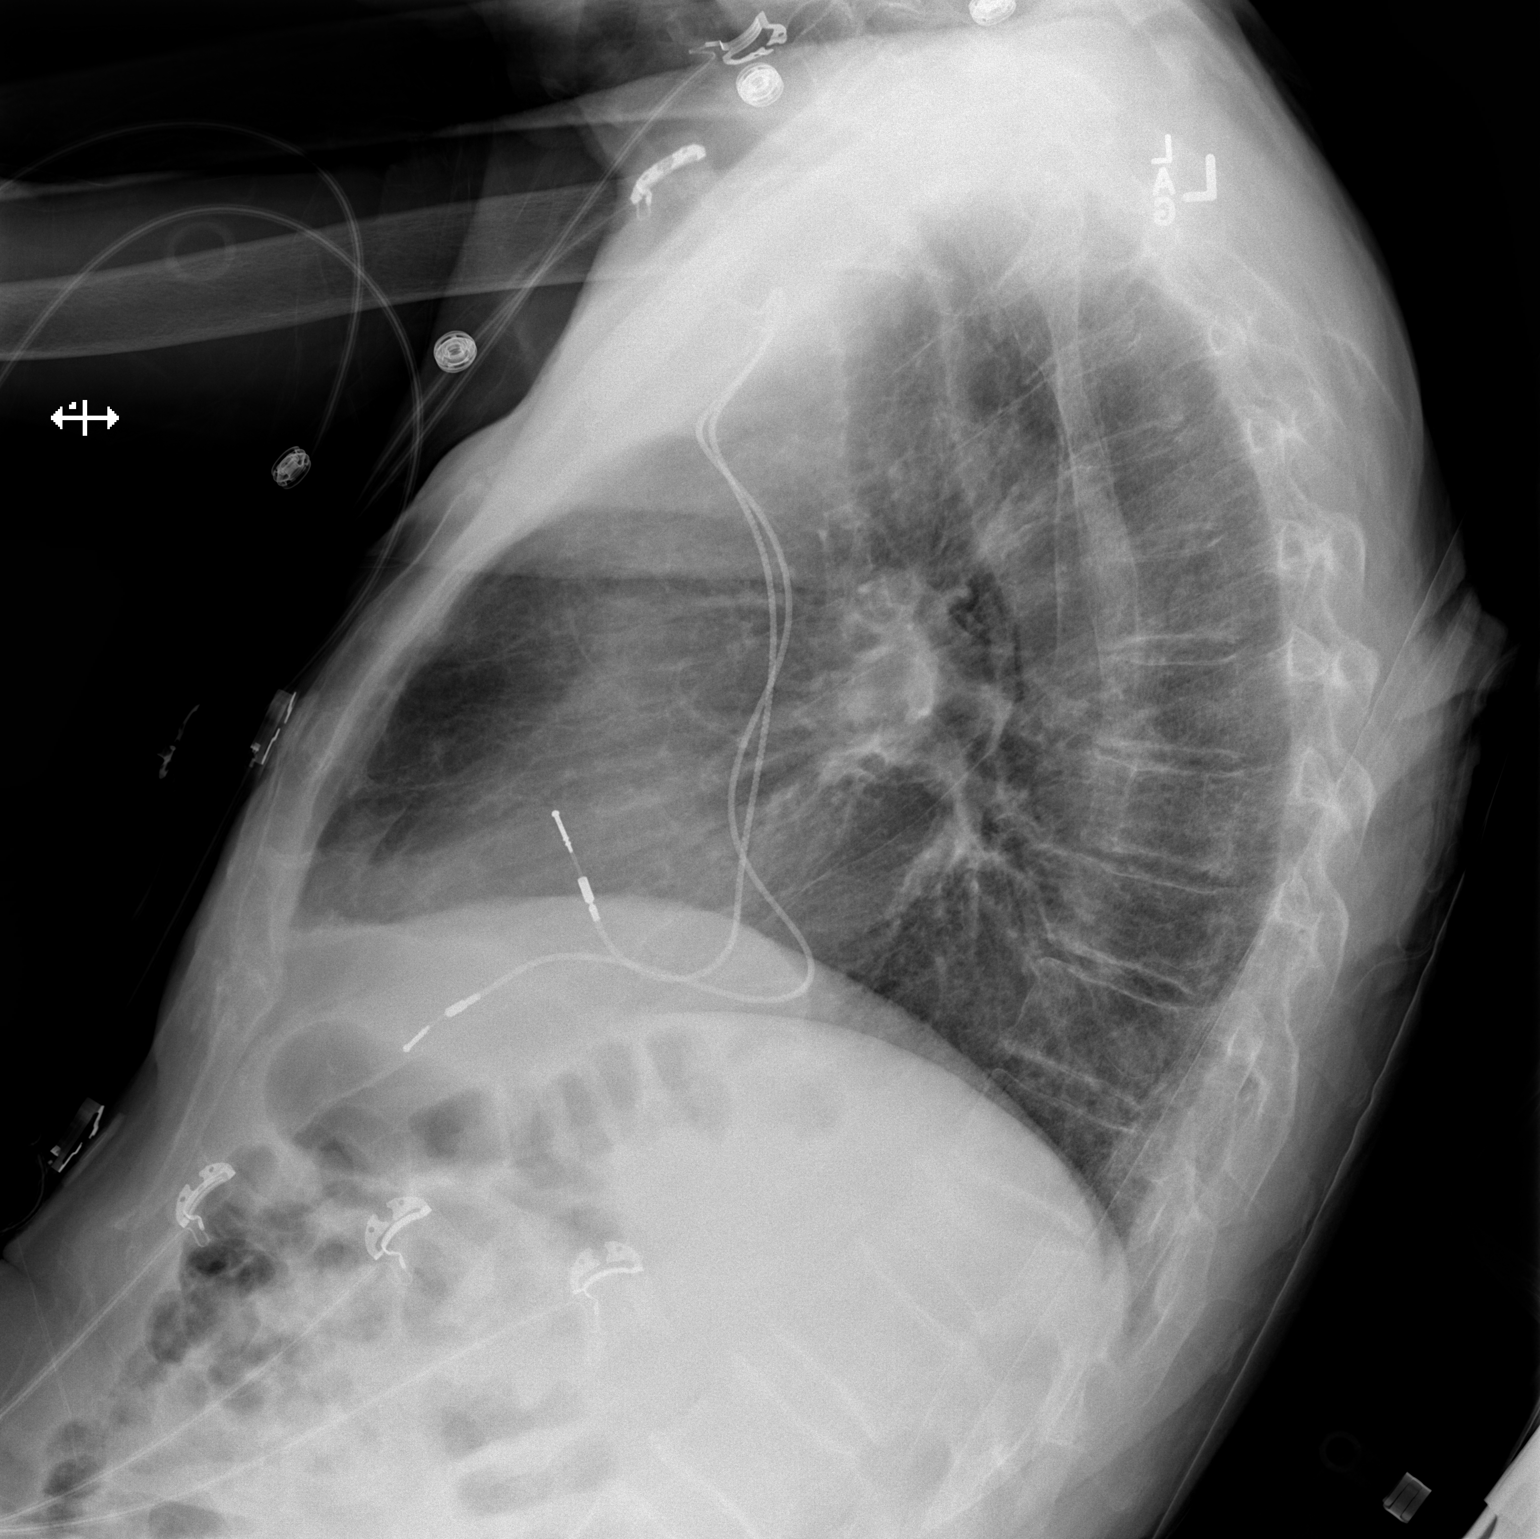

[x chest ap]
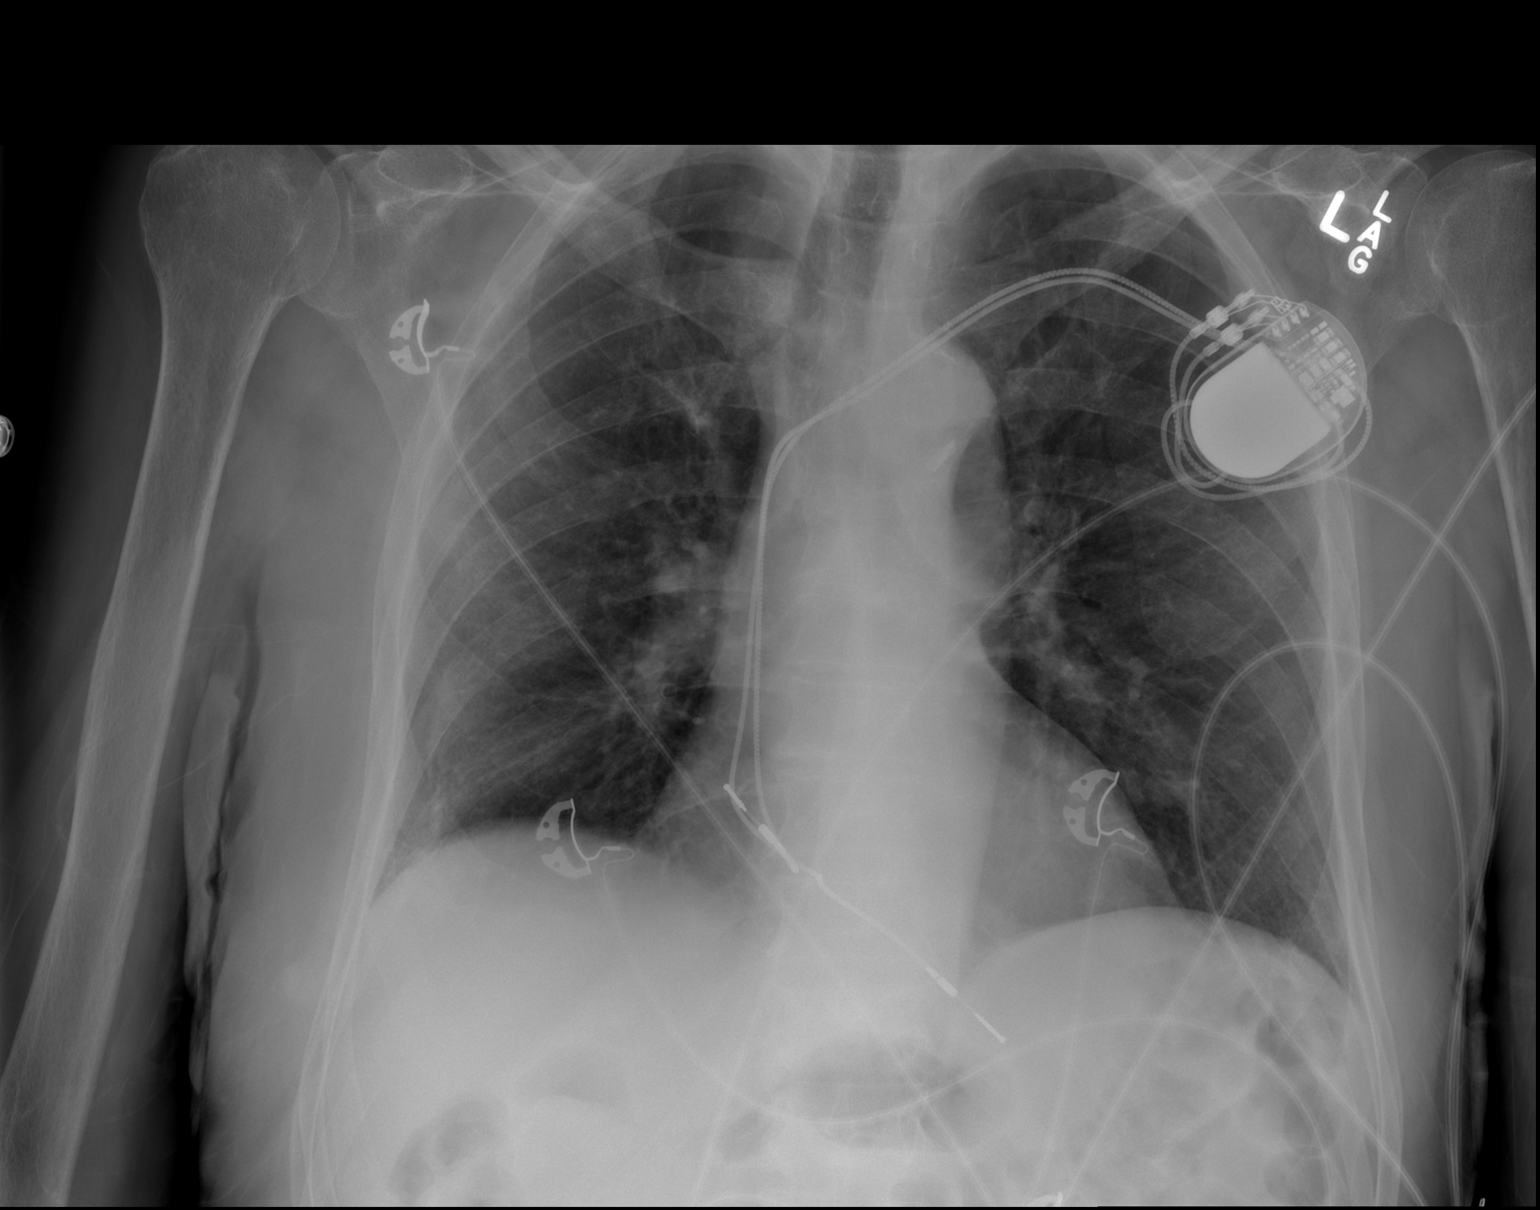

[2 of 2 positions shown; findings below may reference images not displayed]

FINDINGS: The heart size and mediastinal contours are within normal limits.
Both lungs are clear. No pneumothorax or pleural effusion is noted.
Left-sided pacemaker is again noted. Atherosclerosis of thoracic
aorta is noted. The visualized skeletal structures are unremarkable.
IMPRESSION: Aortic atherosclerosis.  No acute cardiopulmonary abnormality seen.
